# Patient Record
Sex: Male | Born: 1945 | Race: White | Hispanic: No
Health system: Midwestern US, Community
[De-identification: ages and names within clinical notes are randomized; demographics above are authoritative.]

## PROBLEM LIST (undated history)

## (undated) DIAGNOSIS — I10 Essential (primary) hypertension: Secondary | ICD-10-CM

## (undated) DIAGNOSIS — E039 Hypothyroidism, unspecified: Secondary | ICD-10-CM

## (undated) DIAGNOSIS — R52 Pain, unspecified: Secondary | ICD-10-CM

## (undated) DIAGNOSIS — E785 Hyperlipidemia, unspecified: Secondary | ICD-10-CM

---

## 2011-03-13 ENCOUNTER — Encounter

## 2011-05-01 LAB — ALT: ALT: 37 U/L (ref 4–40)

## 2011-05-01 LAB — LIPID PANEL
Chol/HDL Ratio: 3 (ref <5.0)
Cholesterol: 148 mg/dL (ref <200)
HDL: 49 mg/dL (ref >40)
LDL Cholesterol: 89 mg/dL (ref <100)
Triglycerides: 49 mg/dL (ref <150)

## 2011-05-15 NOTE — Patient Instructions (Signed)
Continue meds    Be active    Sunscreen and hats

## 2011-05-15 NOTE — Progress Notes (Signed)
Subjective:      Patient ID: Keith Pope is a 65 y.o. male.    Hand Pain   The incident occurred more than 1 week ago (increased pain left ring finger with swelling at PIP.  no trauma.  same side as duptyens.  not severe  enough to need any more medications or attention of hand specialist.). There was no injury mechanism. The pain is present in the left fingers. The quality of the pain is described as aching. The pain does not radiate. The pain is mild. The pain has been fluctuating since the incident. The symptoms are aggravated by nothing. He has tried NSAIDs for the symptoms. The treatment provided mild relief.       Review of Systems   Constitutional: Negative.    HENT: Negative.    Eyes: Negative.    Respiratory: Negative.    Cardiovascular: Negative.    Gastrointestinal: Negative.    Musculoskeletal: Positive for back pain.   Neurological: Negative for weakness and light-headedness.       Objective:   Physical Exam   Constitutional: He appears well-developed.   HENT:        ak and sk still. (did not use 5-fu).   Neck: Neck supple.        Posterior scar   Cardiovascular: Normal rate, regular rhythm and normal heart sounds.    Pulmonary/Chest: Breath sounds normal.   Abdominal: Soft. Bowel sounds are normal. He exhibits no mass.   Musculoskeletal: Normal range of motion.        Slightly swollen left PIP #4.  Full rom with little stiffness at joint.  dupytrens same.   Skin:        Chronic changes to face.  Rosacea look to cheeks.       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  allchronic problems stable.  Labs good.      Plan:      ctnu rx.  See 6 months.  Labs before.  Sunscreen and hats.

## 2011-09-11 ENCOUNTER — Telehealth

## 2011-09-11 NOTE — Telephone Encounter (Signed)
[facility] x2 to pick up order for Left Hand X-ray.

## 2011-09-11 NOTE — Telephone Encounter (Signed)
Patient states still  having a lot of pain in knuckle ring finger on left hand.  What is the next step?  Please advise.

## 2011-09-11 NOTE — Telephone Encounter (Signed)
Xray the left hand with attention to #4 PIP joint  See after xray

## 2011-09-12 NOTE — Progress Notes (Signed)
Subjective:      Patient ID: Keith Pope is a 65 y.o. male.    HPI    Review of Systems    Objective:   Physical Exam    Assessment:      Pt wants a flu shot      Plan:      Give pt flu shot

## 2011-09-14 NOTE — Telephone Encounter (Signed)
Report in my file.

## 2011-09-17 LAB — CBC WITH AUTO DIFFERENTIAL
Absolute Eos #: 0.1 10*3/uL (ref 0.0–0.4)
Absolute Lymph #: 1.6 10*3/uL (ref 1.0–4.8)
Absolute Mono #: 0.4 10*3/uL (ref 0.1–1.2)
Absolute Neut #: 4.1 10*3/uL (ref 1.8–7.7)
Basophils Absolute: 0 10*3/uL (ref 0.0–0.2)
Basophils: 0 % (ref 0–2)
Eosinophils %: 2 % (ref 1–4)
Hematocrit: 38.5 % — ABNORMAL LOW (ref 41–53)
Hemoglobin: 13.4 g/dL — ABNORMAL LOW (ref 13.5–17.5)
Lymphocytes: 26 % (ref 24–44)
MCH: 32.3 pg (ref 26–34)
MCHC: 34.9 g/dL (ref 31–37)
MCV: 92.6 fL (ref 80–100)
MPV: 8.1 fL (ref 6.0–12.0)
Monocytes: 7 % (ref 2–11)
Platelets: 227 10*3/uL (ref 140–450)
RBC: 4.15 m/uL — ABNORMAL LOW (ref 4.5–5.9)
RDW: 12.9 % (ref 12.5–15.4)
Seg Neutrophils: 65 % (ref 36–66)
WBC: 6.3 10*3/uL (ref 3.5–11.0)

## 2011-09-17 LAB — URIC ACID: Uric Acid: 5.5 mg/dL (ref 3.5–7.3)

## 2011-09-17 NOTE — Progress Notes (Signed)
Subjective:      Patient ID: Keith Pope is a 65 y.o. male.    HPI for recheck on finger.  Still painful with decrease in rom.  Some redness.  No other joint involved. No pain in palm with the dupytrens.  Had xray which showed arthritis.  Review of Systems   Constitutional: Negative for fever and chills.   Musculoskeletal: Positive for joint swelling. Negative for myalgias.   Skin: Negative for rash.       Objective:   Physical Exam   Musculoskeletal:        Red swollen left #4PIP with decrease in active and passive rom.  No warmth.  No other joint involved.  No rash.   Xray as above       Assessment:      ?gout; septic arthritis not likely      Plan:      Cbc, uric acid, esr  Stop celebrex and use duexis tid(samples)  i will call with results

## 2011-09-17 NOTE — Patient Instructions (Signed)
Stop your CELEBREX    Take the DUEXIS, one tablet with food, three times a day    i will call you with the blood results

## 2011-09-17 NOTE — Telephone Encounter (Signed)
Informed xray  Left  Hand,  arthritis in fingers. appointment  Today.

## 2011-09-18 LAB — SEDIMENTATION RATE: Sed Rate: 12 mm — ABNORMAL HIGH (ref 0–10)

## 2011-09-20 NOTE — Telephone Encounter (Signed)
Tell all were normal.   This info is on a result note from the day i saw them.

## 2011-09-21 NOTE — Telephone Encounter (Signed)
Rxs called to [facility],--spoke to Charise Killian-- #1- IIbuprofen 8oo mg, #270, sig 1 po tid with food, refill x3. #2--Pepcid 20 mg -#180,1 po bid , refill x3.

## 2011-09-21 NOTE — Telephone Encounter (Signed)
INFORMED LABS NORMAL. STATES since med duexis 800 mg , left ring finger not as sore, pain not as bad. Swelling is down a little and can bend a little bit. He has one more pill left for tonight.  What  Is next step? ( refill, go back on celebrex; or see specialist?)

## 2011-09-21 NOTE — Telephone Encounter (Signed)
Left message on recorder meds. Were called into his mail order pharmacy.

## 2011-09-21 NOTE — Telephone Encounter (Signed)
Results of lab work done 09/17/11 here at office?  Also, samples of Duexis 800mg ?  Please call patient back.

## 2011-09-21 NOTE — Telephone Encounter (Signed)
See result note and the note from yesterday.  Rx:  Ibuprofen 800 tid with food  #90  rfx12  pepcid 20  #60  Bid  rfx12

## 2011-09-27 NOTE — Telephone Encounter (Signed)
Is ok to take all three meds together

## 2011-09-27 NOTE — Telephone Encounter (Signed)
Patient informed of tests results.

## 2011-09-27 NOTE — Telephone Encounter (Signed)
Informed ok to take meds together.

## 2011-09-27 NOTE — Telephone Encounter (Signed)
Patient wants to know if he is to take Famotidine and the Ibuprofen together.  Also, Dr. Thermon Leyland  Wants him to take ASA 81mg  QD. And wants to know if its okay to take that with these meds. Please advise.

## 2011-10-05 MED ORDER — LEVOTHYROXINE SODIUM 75 MCG PO TABS
75 MCG | ORAL_TABLET | ORAL | Status: DC
Start: 2011-10-05 — End: 2012-06-07

## 2011-11-19 LAB — LIPID PANEL
Chol/HDL Ratio: 3.3 (ref <5.0)
Cholesterol: 152 mg/dL (ref <200)
HDL: 46 mg/dL (ref >40)
LDL Cholesterol: 87 mg/dL (ref <100)
Triglycerides: 95 mg/dL (ref <150)

## 2011-11-19 LAB — TSH: TSH: 2.19 mIU/L (ref 0.35–5.50)

## 2011-11-19 LAB — AST: AST: 26 U/L (ref 8–36)

## 2011-11-21 MED ORDER — HYDROCHLOROTHIAZIDE 25 MG PO TABS
25 MG | ORAL_TABLET | Freq: Every day | ORAL | Status: DC
Start: 2011-11-21 — End: 2012-01-29

## 2011-11-21 NOTE — Patient Instructions (Signed)
See in 6 months    Continue your medications    Be active physically and mentally    Do the blood tests one week before visit    Merry xmas

## 2011-11-21 NOTE — Progress Notes (Signed)
Subjective:      Patient ID: Keith Pope is a 65 y.o. male.    Hypertension  This is a chronic problem. The problem is unchanged. The problem is controlled. Pertinent negatives include no anxiety, blurred vision, chest pain, headaches, malaise/fatigue, neck pain, orthopnea, palpitations, peripheral edema, PND, shortness of breath or sweats. There is no history of chronic renal disease.   Hyperlipidemia  This is a chronic problem. Recent lipid tests were reviewed and are normal. Exacerbating diseases include hypothyroidism. He has no history of chronic renal disease, diabetes, liver disease or obesity. Pertinent negatives include no chest pain or shortness of breath.   not get full rom in finger after this summer.  No pain.  Functionally ok    Review of Systems   Constitutional: Negative.  Negative for malaise/fatigue.   HENT: Negative for neck pain.    Eyes: Negative.  Negative for blurred vision.   Respiratory: Negative.  Negative for shortness of breath.    Cardiovascular: Negative for chest pain, palpitations, orthopnea and PND.   Gastrointestinal: Negative.    Genitourinary: Negative.    Musculoskeletal: Negative.    Skin: Negative.    Neurological: Negative.  Negative for headaches.       Objective:   Physical Exam   Vitals reviewed.  Constitutional: He appears well-developed and well-nourished. No distress.   Neck: Normal range of motion. Neck supple.   Cardiovascular: Normal rate, regular rhythm and normal heart sounds.    Pulmonary/Chest: Effort normal and breath sounds normal.   Abdominal: Soft. Bowel sounds are normal. He exhibits no distension and no mass.   Genitourinary: Rectum normal and penis normal.        Decrease in full flexion at PIP #4 left   Musculoskeletal: He exhibits no edema and no tenderness.   Lymphadenopathy:     He has no cervical adenopathy.   Neurological: He is alert.   Skin: Skin is warm and dry.   labs good    Assessment:      Chronic problems  Doing well      Plan:      Stay  active  ctnu present

## 2011-11-28 NOTE — Telephone Encounter (Signed)
I think this should wait for Dr. Nolene Ebbs to address.  Ok xanax rx .25 mg #20 i po qid prn.

## 2011-11-28 NOTE — Telephone Encounter (Addendum)
Patient seen on 11/21/11, prescribed RX Hydrochlorothiazide 25mg .  He states BP is actually getting higher.  It is running around 170/86.  He said his Mother is terminal and didn't know if this could be making his BP go higher?  He wanted to know if you could prescribe something for his anxiety and change BP medication?    Patient called back.  He wanted me to make sure to mention that for the last couple of days he has been experiencing some chest pains-feels like he needs to belch.  He has been taking Tums and that seems to help somewhat.

## 2011-11-28 NOTE — Telephone Encounter (Signed)
Rx called into pharmacy and left message on recorder  Pt advised to check pharm after 5.

## 2011-11-29 NOTE — Telephone Encounter (Signed)
Pt advised- continue HCTZ, we're adding Norvasc 5mg  qd, see Dr Nolene Ebbs in 2-3 weeks. Appt set for 1-7- 13.

## 2011-11-29 NOTE — Telephone Encounter (Signed)
Prescription has been called in, and patient has been informed.

## 2011-11-29 NOTE — Telephone Encounter (Signed)
ctnu HCTZ  Rx:  norvasc 5          #30          One daily    See in 2-3 weeks

## 2011-12-19 NOTE — Patient Instructions (Signed)
i will call as soon as i have a report on the stress test    Continue present medications

## 2011-12-19 NOTE — Progress Notes (Signed)
Subjective:      Patient ID: Keith Pope is a 66 y.o. male.    HPI  For recheck on bp.  Gets low bp readings at home after he uses the exercise bike.  No pain during.  Get sob and some ache in anterior chest when walking in the cold.  No other cardiac or respiratory issue.  Review of Systems   Respiratory: Negative.    Cardiovascular: Negative for palpitations and leg swelling.   Neurological: Negative.        Objective:   Physical Exam   Vitals reviewed.  Constitutional: He appears well-developed and well-nourished.   Neck: Normal range of motion. No JVD present.   Cardiovascular: Normal rate, regular rhythm and normal heart sounds.    Pulmonary/Chest: Effort normal.   Musculoskeletal: He exhibits no edema.   ekg with non specific changes    Assessment:      Hypertension with fall with exercise and cold exposure pain; worrisome for cad with ascvd hx      Plan:      Stress  ctnu present meds

## 2011-12-26 MED ORDER — AMLODIPINE BESYLATE 5 MG PO TABS
5 MG | ORAL_TABLET | Freq: Every day | ORAL | Status: DC
Start: 2011-12-26 — End: 2012-03-06

## 2011-12-26 NOTE — Telephone Encounter (Signed)
[facility] [phone]

## 2012-01-01 NOTE — Telephone Encounter (Signed)
Informed drink plenty of fluids, and f/u prn.  Noted stress test was normal.

## 2012-01-01 NOTE — Telephone Encounter (Signed)
If only drop in bp without any other symptoms just needs to stay well hydrated.  If symptomatic with blood pressure drop, needs stress test

## 2012-01-01 NOTE — Telephone Encounter (Signed)
Informed stress test ok. Patient states bp drops after exercise, and wants to know what to do?

## 2012-01-29 NOTE — Telephone Encounter (Signed)
Call in 30 days for HCTZ to [facility] patient leaving out of town 02/02/12

## 2012-01-30 MED ORDER — HYDROCHLOROTHIAZIDE 25 MG PO TABS
25 MG | ORAL_TABLET | Freq: Every day | ORAL | Status: AC
Start: 2012-01-30 — End: ?

## 2012-03-07 MED ORDER — AMLODIPINE BESYLATE 5 MG PO TABS
5 MG | ORAL_TABLET | Freq: Every day | ORAL | Status: DC
Start: 2012-03-07 — End: 2013-02-27

## 2012-03-10 MED ORDER — SIMVASTATIN 20 MG PO TABS
20 MG | ORAL_TABLET | ORAL | Status: DC
Start: 2012-03-10 — End: 2012-07-02

## 2012-05-14 MED ORDER — TRAMADOL HCL 50 MG PO TABS
50 MG | ORAL_TABLET | ORAL | Status: DC
Start: 2012-05-14 — End: 2012-12-24

## 2012-06-09 MED ORDER — LEVOTHYROXINE SODIUM 75 MCG PO TABS
75 MCG | ORAL_TABLET | ORAL | Status: DC
Start: 2012-06-09 — End: 2013-02-11

## 2012-06-26 LAB — LIPID PANEL
Chol/HDL Ratio: 4.3
Cholesterol, Total: 233
HDL: 54 mg/dL (ref 35–70)
LDL Calculated: 155 mg/dL (ref 0–160)
Triglycerides: 120 mg/dL
VLDL: 24 mg/dL

## 2012-07-02 MED ORDER — ATORVASTATIN CALCIUM 10 MG PO TABS
10 MG | ORAL_TABLET | Freq: Every day | ORAL | Status: DC
Start: 2012-07-02 — End: 2013-05-12

## 2012-07-02 NOTE — Patient Instructions (Signed)
Be active physically and mentally    Eat well balanced diet    Use the ATORVASTATIN for the cholesterol    See in 6 months    Fasting labs one week before visit

## 2012-07-02 NOTE — Progress Notes (Signed)
Diabetic visit information    BP Readings from Last 2 Encounters:   07/02/12 138/70   12/19/11 160/86       LDL Cholesterol (mg/dL)   Date Value   09/81/1914 87         LDL Calculated (mg/dL)   Date Value   7/82/9562 155               Tobacco use:  Patient  reports that he quit smoking about 9 years ago. He does not have any smokeless tobacco history on file.  If Smoker - Cessation materials given?- NA    Is Daily aspirin on medication list?: - No  If not and if the patient also has a history of vascular disease or is at increased risk of cardiovascular disease, the patient may be a candidate for daily aspirin. Discuss with physician.     Diabetic retinal exam done? - No   If yes, document date performed in Health Maintenance.     Is patient taking any medications for diabetes? -   Yes   If yes, see medication list  Are you having any side effects from diabetes medications? -  No    Are you taking cholesterol medication?  -   Yes   If yes, see medication list  Are you having any side effects from cholesterol medications? - No    Have you seen any other physician or provider since your last visit yes - Cardiologist    Have you had any other diagnostic tests since your last visit? no    Have you changed or stopped any medications since your last visit including any over-the-counter medicines, vitamins, or herbal medicines? no     Are you taking all your prescribed medications? Yes  If NO, why? -  N/A    Is patient taking any over the counter medications    No   If yes, see medication list      Diabetic Patient Self-Management Goal for this visit.    What is your goal for your visit today?   [x]  Increase activity-exercise for 30 min daily - 3 to 4 days per week   []  Improved diet - adhere to recommended diabetic diet/low cholesterol    []  Take medications as prescribed and call the office if any troubles with medications develop   []  will check blood sugars at least one time daily   []  schedule an appointment with a  diabetes educator   []  schedule yearly diabetic eye exam   []  will work on weight reduction program   []       Barriers to success: none  Plan for overcoming my barriers: N/A     Confidence: 10/10  Date goal set: 07/02/2012  Date expected to reach goal: 6months                     Microalbumin performed if applicable? - No   (due every 6 to 12 months)    BP taken with correct size cuff? - Yes   Repeated if > 130/80 No     Monofilament placed on counter? - No   (Diabetic foot exam - yearly)  Shoes and socks removed? - No    Reminders - reviewed with patient today - Yes  ?? Tobacco -  If patient uses tobacco and intervention is done today - remember to mark counseling in the vital signs section and to give educational materials.   ?? Foot Exam - If  monofilament exam has not bee done within the past year, prepare the patient and have the testing equipment ready.   ?? Eye Exam - Ask about diabetic retinal (eye) exam and record in health maintenance.  ?? BP - If BP >140/90, repeat reading. Check cuff size.    ?? Microalbumin urine test - If not performed in the last year, notify physician. Enter order in pending status if your physician desires.     Diabetic Health Maintenance  No health maintenance topics applied.      HPI Notes

## 2012-07-02 NOTE — Progress Notes (Signed)
Subjective:      Patient ID: Keith Pope is a 66 y.o. male.    Hyperlipidemia  This is a chronic problem. The current episode started more than 1 year ago. The problem is uncontrolled. Recent lipid tests were reviewed and are high. Exacerbating diseases include hypothyroidism. He has no history of chronic renal disease, diabetes, liver disease, obesity or nephrotic syndrome. Pertinent negatives include no chest pain, focal sensory loss, focal weakness, leg pain, myalgias or shortness of breath. Treatments tried: felt lousy when taking zocor. stopped taking it.    Hypertension  This is a chronic problem. The current episode started more than 1 year ago. The problem is unchanged. The problem is controlled. Pertinent negatives include no anxiety, blurred vision, chest pain, headaches, malaise/fatigue, neck pain, orthopnea, palpitations, peripheral edema, PND, shortness of breath or sweats. There is no history of chronic renal disease.   otherwise feeling fine.    Review of Systems   Constitutional: Negative.  Negative for malaise/fatigue.   HENT: Negative for neck pain.    Eyes: Negative.  Negative for blurred vision.   Respiratory: Negative for shortness of breath.    Cardiovascular: Negative for chest pain, palpitations, orthopnea and PND.   Genitourinary: Negative.    Musculoskeletal: Negative for myalgias.   Neurological: Negative for focal weakness and headaches.       Objective:   Physical Exam   Vitals reviewed.  Constitutional: He is oriented to person, place, and time. He appears well-developed and well-nourished. No distress.   Neck: Neck supple. No JVD present. No thyromegaly present.   Cardiovascular: Normal rate, regular rhythm, normal heart sounds and intact distal pulses.    Pulmonary/Chest: Effort normal and breath sounds normal.   Abdominal: Soft. Bowel sounds are normal.   Musculoskeletal: Normal range of motion. He exhibits no edema and no tenderness.   Lymphadenopathy:     He has no cervical  adenopathy.   Neurological: He is alert and oriented to person, place, and time.   Skin: Skin is warm and dry.   ldl 157    Assessment:      Chronic problems  Adverse reaction to zocor       Plan:      Trial lipitor 10  Check 6 mo

## 2012-08-20 MED ORDER — IBUPROFEN 800 MG PO TABS
800 MG | ORAL_TABLET | ORAL | Status: DC
Start: 2012-08-20 — End: 2013-09-22

## 2012-08-20 MED ORDER — FAMOTIDINE 20 MG PO TABS
20 MG | ORAL_TABLET | ORAL | Status: DC
Start: 2012-08-20 — End: 2013-07-03

## 2012-10-03 MED ORDER — HYDROCHLOROTHIAZIDE 25 MG PO TABS
25 MG | ORAL_TABLET | ORAL | Status: DC
Start: 2012-10-03 — End: 2013-06-24

## 2012-12-24 LAB — LIPID PANEL
Chol/HDL Ratio: 3.2 (ref <5.0)
Cholesterol: 149 mg/dL (ref <200)
HDL: 46 mg/dL (ref >40)
LDL Cholesterol: 84 mg/dL (ref <100)
Triglycerides: 94 mg/dL (ref <150)

## 2012-12-24 LAB — ALT: ALT: 34 U/L (ref 4–40)

## 2012-12-24 LAB — TSH WITH REFLEX: TSH: 3.26 mIU/L (ref 0.35–5.50)

## 2012-12-24 MED ORDER — TRAMADOL HCL 50 MG PO TABS
50 MG | ORAL_TABLET | Freq: Four times a day (QID) | ORAL | Status: DC | PRN
Start: 2012-12-24 — End: 2013-01-02

## 2012-12-24 NOTE — Telephone Encounter (Signed)
Rx called to pharmacy Patient has been notified.

## 2012-12-24 NOTE — Telephone Encounter (Signed)
error 

## 2013-01-02 MED ORDER — TRAMADOL HCL 50 MG PO TABS
50 MG | ORAL_TABLET | Freq: Four times a day (QID) | ORAL | Status: DC | PRN
Start: 2013-01-02 — End: 2013-09-01

## 2013-01-02 NOTE — Progress Notes (Signed)
Subjective:      Patient ID: Keith Pope is a 67 y.o. male.    Hypertension  This is a chronic problem. The current episode started more than 1 year ago. The problem is unchanged. The problem is controlled. Pertinent negatives include no anxiety, blurred vision, chest pain, headaches, malaise/fatigue, neck pain, orthopnea, palpitations, peripheral edema, PND, shortness of breath or sweats. There is no history of chronic renal disease.   Hyperlipidemia  This is a chronic problem. The current episode started more than 1 year ago. The problem is controlled. Recent lipid tests were reviewed and are normal. Exacerbating diseases include hypothyroidism. He has no history of chronic renal disease, diabetes, liver disease, obesity or nephrotic syndrome. Pertinent negatives include no chest pain, focal sensory loss, focal weakness, leg pain, myalgias or shortness of breath.   joints are ok  No hat or cold intollerance. No weight change.  No kidney stone problems.    Review of Systems   Constitutional: Negative for malaise/fatigue.   HENT: Negative for neck pain.    Eyes: Negative for blurred vision.   Respiratory: Negative for shortness of breath.    Cardiovascular: Negative for chest pain, palpitations, orthopnea and PND.   Musculoskeletal: Negative for myalgias.   Neurological: Negative for focal weakness and headaches.       Objective:   Physical Exam   Vitals reviewed.  Constitutional: He is oriented to person, place, and time. He appears well-developed and well-nourished. No distress.   Neck: Normal range of motion. Neck supple. No JVD present. No thyromegaly present.   Cardiovascular: Normal rate, regular rhythm, normal heart sounds and intact distal pulses.  Exam reveals no gallop and no friction rub.    No murmur heard.  Pulmonary/Chest: Effort normal and breath sounds normal. No respiratory distress.   Abdominal: Soft. Bowel sounds are normal. He exhibits no mass. There is no tenderness.   Genitourinary: Rectum  normal and prostate normal.   Musculoskeletal: He exhibits no edema and no tenderness.   Lymphadenopathy:     He has no cervical adenopathy.   Neurological: He is alert and oriented to person, place, and time.   Skin: Skin is warm and dry.   labs ok    Assessment:      Doing well       Plan:      ctnu present  See 6 mo

## 2013-01-02 NOTE — Patient Instructions (Signed)
Be active physically and mentally    Eat well balanced diet    contnue your medications    See in 6 months    Fasting blood test one week before visit

## 2013-01-02 NOTE — Progress Notes (Signed)
Health Maintenance   Topic Date Due   ??? Colon Cancer Screening Colonoscopy  11/17/1995   ??? Psa Counseling  11/17/1995   ??? Zostavax Vaccine  11/16/2005   ??? Pneumovax Vaccine Adult (#1) 11/16/2010   ??? Depression Screening  11/16/2010   ??? Flu Vaccine Yearly (Adult)  08/10/2013   ??? Tetanus Vaccine Adult (11 Years And Up)  02/04/2020       No results found for this basename: laba1c             ( goal A1C is < 7)   No results found for this basename: labmicr     LDL Cholesterol (mg/dL)   Date Value   12/28/1476 84         LDL Calculated (mg/dL)   Date Value   2/95/6213 155        (goal LDL is <100)   AST (U/L)   Date Value   11/19/2011 26         ALT (U/L)   Date Value   12/24/2012 34      BP Readings from Last 3 Encounters:   07/02/12 138/70   12/19/11 160/86   11/21/11 152/80          (goal 120/80)      Next Visit Date:  01/02/2013    Patient Active Problem List:     Hypothyroid     Kidney stones     OA (osteoarthritis)     DDD (degenerative disc disease), cervical     Colon polyps     Increased serum lipids     Hypertension          Hypertension visit information    BP Readings from Last 2 Encounters:   07/02/12 138/70   12/19/11 160/86       LDL Calculated (mg/dL)   Date Value   0/86/5784 155         LDL Cholesterol (mg/dL)   Date Value   6/96/2952 84         HDL (mg/dL)   Date Value   8/41/3244 46             Are you taking any over-the-counter medicines, vitamins, or herbal medicines? - no     Are you taking any medications for hypertension?  -  Yes   If yes, see medication list  Are you having any side effects from hypertension medications? -  No     Are you taking cholesterol medication?  -   No   If yes, see medication list  Are you having any side effects from cholesterol medications? - No    Tobacco use:  Patient  reports that he quit smoking about 9 years ago. He does not have any smokeless tobacco history on file.  If Smoker - Cessation materials given? - NA    Have you seen any other physician or provider since your  last visit yes -Ortho     Have you had any other diagnostic tests since your last visit? no    Have you changed or stopped any medications since your last visit including any over-the-counter medicines, vitamins, or herbal medicines? no     Are you taking all your prescribed medications? Yes  If NO, why? -  N/A    Patient Self-Management Goal for this visit.     What is your goal for your visit today?    []  I will increase my physical activity level  []  I will exercise  for 30 minutes, 3 to 5 days a week  []  I will take all medications as prescribed  []  I will follow low cholesterol/low sodium diet recommendation  []  I will work on weight reduction  []  I will follow up with specialists as recommend  [x]  I will check my blood pressure weekly   []      Barriers to success: none  Plan for overcoming my barriers: N/A     Confidence: 8/10  Date goal set: 01/02/2013  Date expected to reach goal: 6months       Reminders - reviewed with patient today - Yes  ?? Tobacco -  If patient uses tobacco and intervention is done today - remember to mark counseling in the vital signs section and to give educational materials.   ?? BP - If BP >140/90, repeat reading. Check cuff size.        HPI Notes

## 2013-01-21 NOTE — Telephone Encounter (Signed)
Keith Pope, from [facility] home health called wanted [place] to know she discharge physical therapy.

## 2013-02-11 MED ORDER — LEVOTHYROXINE SODIUM 75 MCG PO TABS
75 MCG | ORAL_TABLET | ORAL | Status: DC
Start: 2013-02-11 — End: 2013-05-31

## 2013-02-27 MED ORDER — AMLODIPINE BESYLATE 5 MG PO TABS
5 MG | ORAL_TABLET | ORAL | Status: DC
Start: 2013-02-27 — End: 2013-11-19

## 2013-05-12 MED ORDER — ATORVASTATIN CALCIUM 10 MG PO TABS
10 MG | ORAL_TABLET | ORAL | Status: DC
Start: 2013-05-12 — End: 2014-05-07

## 2013-06-01 MED ORDER — LEVOTHYROXINE SODIUM 75 MCG PO TABS
75 MCG | ORAL_TABLET | ORAL | Status: DC
Start: 2013-06-01 — End: 2014-05-04

## 2013-06-17 LAB — BASIC METABOLIC PANEL
Anion Gap: 17 mmol/L — ABNORMAL HIGH (ref 8–16)
BUN: 15 mg/dL (ref 8–23)
CO2: 26 mmol/L (ref 20–31)
Calcium: 9.7 mg/dL (ref 8.6–10.0)
Chloride: 102 mmol/L (ref 98–107)
Creatinine: 0.87 mg/dL (ref 0.70–1.20)
GFR African American: >60 mL/min (ref >60)
GFR Non-African American: >60 mL/min (ref >60)
Glucose: 91 mg/dL (ref 70–99)
Potassium: 4.1 mmol/L (ref 3.5–5.1)
Sodium: 141 mmol/L (ref 133–142)

## 2013-06-17 LAB — LIPID PANEL
Chol/HDL Ratio: 2.8 (ref <5)
Cholesterol: 161 mg/dL (ref <200)
HDL: 57 mg/dL (ref >40)
LDL Cholesterol: 87 mg/dL (ref 0–130)
Triglycerides: 86 mg/dL (ref <150)

## 2013-06-17 LAB — ALT: ALT: 22 U/L (ref 5–41)

## 2013-06-17 LAB — TSH: TSH: 1.76 mIU/L (ref 0.27–4.20)

## 2013-06-24 NOTE — Patient Instructions (Signed)
Be active physically and mentally    Eat well balanced diet    Continue your medications    See in 6 months

## 2013-06-24 NOTE — Progress Notes (Signed)
Subjective:      Patient ID: Keith Pope is a 67 y.o. male.    Hypertension  This is a chronic problem. The current episode started more than 1 year ago. The problem is unchanged. The problem is controlled. Pertinent negatives include no anxiety, blurred vision, chest pain, headaches, malaise/fatigue, neck pain, orthopnea, palpitations, peripheral edema, PND, shortness of breath or sweats. There is no history of chronic renal disease.   Toe Pain   Incident onset: no injury. just onset of pain at MTP #1 left fot. worse with walking up grades. no redness or swelling. The quality of the pain is described as aching. The pain is mild. The pain has been fluctuating since onset. Pertinent negatives include no inability to bear weight, loss of motion, loss of sensation, muscle weakness, numbness or tingling. The symptoms are aggravated by movement. He has tried acetaminophen, NSAIDs and rest for the symptoms. The treatment provided moderate relief.   Hyperlipidemia  This is a chronic problem. The current episode started more than 1 year ago. The problem is controlled. Recent lipid tests were reviewed and are normal. Exacerbating diseases include hypothyroidism. He has no history of chronic renal disease, diabetes, liver disease, obesity or nephrotic syndrome. Pertinent negatives include no chest pain, focal sensory loss, focal weakness, leg pain, myalgias or shortness of breath.       Review of Systems   Constitutional: Positive for activity change. Negative for malaise/fatigue, appetite change and unexpected weight change.   HENT: Negative for neck pain.    Eyes: Negative for blurred vision.   Respiratory: Negative for shortness of breath.    Cardiovascular: Negative for chest pain, palpitations, orthopnea and PND.   Gastrointestinal: Negative.    Genitourinary: Negative.    Musculoskeletal: Positive for arthralgias and gait problem. Negative for myalgias.   Skin: Negative.    Neurological: Negative for tingling, focal  weakness, numbness and headaches.   Hematological: Negative.        Objective:   Physical Exam   Constitutional: He is oriented to person, place, and time. He appears well-developed and well-nourished. No distress.   Neck: Neck supple.   Cardiovascular: Normal rate, regular rhythm, normal heart sounds and intact distal pulses.    Pulmonary/Chest: Effort normal and breath sounds normal.   Abdominal: Soft. Bowel sounds are normal. There is no tenderness.   Musculoskeletal: Normal range of motion. He exhibits no edema or tenderness.   MTP with no tenderness, redness, swelling.   Lymphadenopathy:     He has no cervical adenopathy.   Neurological: He is alert and oriented to person, place, and time.   Skin: Skin is warm and dry.   Vitals reviewed.  labs all good    Assessment:      Chronic problems stable  Arthritis toe joint       Plan:      ctnu presetn  See instructions

## 2013-06-24 NOTE — Progress Notes (Signed)
Hypertension visit information    BP Readings from Last 2 Encounters:   01/02/13 150/64   07/02/12 138/70       LDL Calculated (mg/dL)   Date Value   1/61/0960 155         LDL Cholesterol (mg/dL)   Date Value   03/14/4097 87         HDL (mg/dL)   Date Value   12/10/9145 57         BUN (mg/dL)   Date Value   07/11/9561 15         CREATININE (mg/dL)   Date Value   12/13/863 0.87         Glucose (mg/dL)   Date Value   06/17/4695 91             Are you taking any over-the-counter medicines, vitamins, or herbal medicines? - no     Are you taking any medications for hypertension?  -  Yes   If yes, see medication list  Are you having any side effects from hypertension medications? -  No     Are you taking cholesterol medication?  -   Yes   If yes, see medication list  Are you having any side effects from cholesterol medications? - No    Tobacco use:  Patient  reports that he quit smoking about 10 years ago. He does not have any smokeless tobacco history on file.  If Smoker - Cessation materials given? - No    Have you seen any other physician or provider since your last visit no    Have you had any other diagnostic tests since your last visit? no    Have you changed or stopped any medications since your last visit including any over-the-counter medicines, vitamins, or herbal medicines? no     Are you taking all your prescribed medications? Yes  If NO, why? -  N/A    Patient Self-Management Goal for this visit.     What is your goal for your visit today?    []  I will increase my physical activity level  []  I will exercise for 30 minutes, 3 to 5 days a week  []  I will take all medications as prescribed  []  I will follow low cholesterol/low sodium diet recommendation  []  I will work on weight reduction  []  I will follow up with specialists as recommend  []  I will check my blood pressure weekly   []  6 month check    Barriers to success: none  Plan for overcoming my barriers: N/A     Confidence: 10/10  Date goal set: 06/24/2013  Date  expected to reach goal: 1day       Reminders - reviewed with patient today - No  ?? Tobacco -  If patient uses tobacco and intervention is done today - remember to mark counseling in the vital signs section and to give educational materials.   ?? BP - If BP >140/90, repeat reading. Check cuff size.      *Ensure Health Maintenance Modifier for Lipid screening Annual is added to Health Maintenance*      Health Maintenance Due   Topic Date Due   ??? COLON CANCER SCREENING COLONOSCOPY  11/17/1995   ??? ZOSTAVAX VACCINE  11/16/2005   ??? PNEUMOVAX VACCINE ADULT (#1) 11/16/2010   ??? DEPRESSION SCREENING  11/16/2010   ??? FALLS RISK  11/16/2010       Medical history Review  Past Medical, Family, and Social  History reviewed and  contribute to the patient presenting condition    HPI Notes

## 2013-07-03 MED ORDER — FAMOTIDINE 20 MG PO TABS
20 MG | ORAL_TABLET | ORAL | Status: DC
Start: 2013-07-03 — End: 2014-06-28

## 2013-07-31 MED ORDER — HYDROCHLOROTHIAZIDE 25 MG PO TABS
25 MG | ORAL_TABLET | ORAL | Status: DC
Start: 2013-07-31 — End: 2013-12-30

## 2013-09-01 MED ORDER — TRAMADOL HCL 50 MG PO TABS
50 MG | ORAL_TABLET | ORAL | Status: DC
Start: 2013-09-01 — End: 2013-12-30

## 2013-09-01 NOTE — Telephone Encounter (Signed)
Rx called to pharmacy

## 2013-09-22 MED ORDER — IBUPROFEN 800 MG PO TABS
800 MG | ORAL_TABLET | ORAL | Status: AC
Start: 2013-09-22 — End: ?

## 2013-09-23 NOTE — Progress Notes (Deleted)
Subjective:      Patient ID: Keith Pope is a 67 y.o. male.    HPI    Review of Systems    Objective:   Physical Exam    Assessment:      ***      Plan:      ***

## 2013-09-23 NOTE — Progress Notes (Signed)
Patient here for flu shot.  It was given and was tolerated well.

## 2013-11-19 MED ORDER — AMLODIPINE BESYLATE 5 MG PO TABS
5 MG | ORAL_TABLET | ORAL | Status: DC
Start: 2013-11-19 — End: 2014-11-14

## 2013-12-30 MED ORDER — TRAMADOL HCL 50 MG PO TABS
50 MG | ORAL_TABLET | Freq: Four times a day (QID) | ORAL | Status: AC | PRN
Start: 2013-12-30 — End: ?

## 2013-12-30 NOTE — Progress Notes (Signed)
Hypertension visit information    BP Readings from Last 2 Encounters:   06/24/13 134/74   01/02/13 150/64       LDL Calculated (mg/dL)   Date Value   8/11/91477/17/2013 155         LDL Cholesterol (mg/dL)   Date Value   8/2/95627/08/2013 87         HDL (mg/dL)   Date Value   1/3/08657/08/2013 57         BUN (mg/dL)   Date Value   7/8/46967/08/2013 15         CREATININE (mg/dL)   Date Value   2/9/52847/08/2013 0.87         Glucose (mg/dL)   Date Value   1/3/24407/08/2013 91             Are you taking any over-the-counter medicines, vitamins, or herbal medicines? - no     Are you taking any medications for hypertension?  -  Yes   If yes, see medication list  Are you having any side effects from hypertension medications? -  No     Are you taking cholesterol medication?  -   No   If yes, see medication list  Are you having any side effects from cholesterol medications? - No    Tobacco use:  Patient  reports that he quit smoking about 10 years ago. He does not have any smokeless tobacco history on file.  If Smoker - Cessation materials given? - NA    Have you seen any other physician or provider since your last visit no    Have you had any other diagnostic tests since your last visit? no    Have you changed or stopped any medications since your last visit including any over-the-counter medicines, vitamins, or herbal medicines? no     Are you taking all your prescribed medications? Yes  If NO, why? -  N/A           Patient Self-Management Goal for this visit.   What is your goal for your visit today? - check up   Barriers to success: none   Plan for overcoming my barriers: N/A      Confidence: 10/10   Date goal set: 12/30/2013   Date expected to reach goal: today      Medical history Review  Past Medical, Family, and Social History reviewed and does contribute to the patient presenting condition    Health Maintenance Due   Topic Date Due   ??? PNEUMOVAX VACCINE ADULT (##1) 11/16/2010       Reminders - reviewed with patient today - Yes  ?? Tobacco -  If patient uses tobacco and  intervention is done today - remember to mark counseling in the vital signs section and to give educational materials.   ?? BP - If BP >140/90, repeat reading. Check cuff size.        HPI Notes

## 2013-12-30 NOTE — Patient Instructions (Addendum)
Be active physically and mentally    Eat well balanced diet    Continue your medications    See in 6 months    Fasting blood test one week before visit

## 2013-12-30 NOTE — Progress Notes (Signed)
Subjective:      Patient ID: Keith Pope is a 68 y.o. male.    Hypertension  This is a chronic problem. The current episode started more than 1 year ago. The problem is unchanged. The problem is controlled. Pertinent negatives include no anxiety, blurred vision, chest pain, headaches, malaise/fatigue, neck pain, orthopnea, palpitations, peripheral edema, PND, shortness of breath or sweats. There is no history of chronic renal disease.   Hyperlipidemia  This is a chronic problem. The current episode started more than 1 year ago. The problem is controlled. Recent lipid tests were reviewed and are normal. Exacerbating diseases include hypothyroidism. He has no history of chronic renal disease, diabetes, liver disease, obesity or nephrotic syndrome. Pertinent negatives include no chest pain, focal sensory loss, focal weakness, leg pain, myalgias or shortness of breath.     No kidney stone issues.  Colonoscopy UTD and will be getting this year; got letter from [facility].  No endocrine problems.  Some spasm and cramp in left foot and toes and ankle. Seems worse at night. Gets plantar flexion of great toe and ankle.    Review of Systems   Constitutional: Negative for malaise/fatigue, activity change, appetite change and unexpected weight change.   HENT: Negative.    Eyes: Negative for blurred vision.   Respiratory: Negative for cough, shortness of breath and stridor.    Cardiovascular: Negative for chest pain, palpitations, orthopnea, leg swelling and PND.   Gastrointestinal: Negative.    Genitourinary: Negative.    Musculoskeletal: Negative for myalgias and neck pain.   Skin: Negative.    Neurological: Negative for focal weakness and headaches.       Objective:   Physical Exam   Constitutional: He is oriented to person, place, and time. He appears well-developed and well-nourished. No distress.   Neck: Normal range of motion. Neck supple. No thyromegaly present.   Cardiovascular: Normal rate, regular rhythm and normal heart  sounds.  Exam reveals no gallop.    No murmur heard.  Pulmonary/Chest: Effort normal and breath sounds normal.   Abdominal: Soft. Bowel sounds are normal. He exhibits no mass. There is no tenderness.   Genitourinary: Rectum normal and prostate normal.   Musculoskeletal: Normal range of motion. He exhibits no edema or tenderness.   Lymphadenopathy:     He has no cervical adenopathy.   Neurological: He is alert and oriented to person, place, and time. He exhibits normal muscle tone.   Skin: Skin is warm and dry.   Vitals reviewed.      Assessment:      Lots chronic problems       Plan:      See orders and instructions

## 2014-05-04 MED ORDER — LEVOTHYROXINE SODIUM 75 MCG PO TABS
75 MCG | ORAL_TABLET | ORAL | Status: DC
Start: 2014-05-04 — End: 2015-04-09

## 2014-05-07 MED ORDER — ATORVASTATIN CALCIUM 10 MG PO TABS
10 MG | ORAL_TABLET | ORAL | Status: AC
Start: 2014-05-07 — End: ?

## 2014-06-28 MED ORDER — FAMOTIDINE 20 MG PO TABS
20 MG | ORAL_TABLET | ORAL | Status: DC
Start: 2014-06-28 — End: 2015-05-31

## 2014-07-26 MED ORDER — HYDROCHLOROTHIAZIDE 25 MG PO TABS
25 MG | ORAL_TABLET | ORAL | Status: AC
Start: 2014-07-26 — End: ?

## 2014-11-15 MED ORDER — AMLODIPINE BESYLATE 5 MG PO TABS
5 MG | ORAL_TABLET | ORAL | Status: AC
Start: 2014-11-15 — End: ?

## 2015-01-10 ENCOUNTER — Encounter

## 2015-04-11 MED ORDER — LEVOTHYROXINE SODIUM 75 MCG PO TABS
75 MCG | ORAL_TABLET | ORAL | Status: DC
Start: 2015-04-11 — End: 2015-07-10

## 2015-04-11 NOTE — Telephone Encounter (Signed)
Health Maintenance   Topic Date Due   ??? HEPATITIS  C SCREENING  01-20-1946   ??? PNEUMO VAC >= 65 (1 of 2 - PCV13) 11/16/2010   ??? FLU VACCINE YEARLY (ADULT)  07/11/2015   ??? LIPIDS  06/17/2018   ??? TETANUS VACCINE ADULT (11 YEARS AND UP)  02/04/2020   ??? COLON CANCER SCREENING COLONOSCOPY  11/21/2020   ??? ZOSTAVAX VACCINE  Completed       No results found for: LABA1C          ( goal A1C is < 7)   No results found for: LABMICR  LDL CHOLESTEROL (mg/dL)   Date Value   29/52/841307/08/2013 87     LDL CALCULATED (mg/dL)   Date Value   24/40/102707/17/2013 155       (goal LDL is <100)   AST (U/L)   Date Value   11/19/2011 26     ALT (U/L)   Date Value   06/17/2013 22     BUN (mg/dL)   Date Value   25/36/644007/08/2013 15     BP Readings from Last 3 Encounters:   12/30/13 130/60   06/24/13 134/74   01/02/13 150/64          (goal 120/80)    All Future Testing planned in [facility]  Lab Frequency Next Occurrence   Lipid Panel Once 01/11/2016   ALT Once 01/11/2016   Basic Metabolic Panel Once 01/11/2016   TSH with Reflex Once 01/11/2016       Next Visit Date:  No future appointments.         Patient Active Problem List:     Hypothyroid     Kidney stones     OA (osteoarthritis)     DDD (degenerative disc disease), cervical     Colon polyps     Increased serum lipids     Hypertension

## 2015-05-31 MED ORDER — FAMOTIDINE 20 MG PO TABS
20 MG | ORAL_TABLET | ORAL | 0 refills | Status: DC
Start: 2015-05-31 — End: 2015-08-27

## 2015-05-31 NOTE — Telephone Encounter (Signed)
Our records indicate that it has been longer than 12 months since you've seen your primary care physician. Please contact our office to schedule an appointment.      Health Maintenance   Topic Date Due   ??? HEPATITIS  C SCREENING  [birth year]   ??? PNEUMO VAC >= 65 (1 of 2 - PCV13) 11/16/2010   ??? FLU VACCINE YEARLY (ADULT)  07/11/2015   ??? LIPIDS  06/17/2018   ??? TETANUS VACCINE ADULT (11 YEARS AND UP)  02/04/2020   ??? COLON CANCER SCREENING COLONOSCOPY  11/21/2020   ??? ZOSTAVAX VACCINE  Completed       No results found for: LABA1C          ( goal A1C is < 7)   No results found for: LABMICR  LDL CHOLESTEROL (mg/dL)   Date Value   60/03/5996 87     LDL CALCULATED (mg/dL)   Date Value   74/14/2395 155       (goal LDL is <100)   AST (U/L)   Date Value   11/19/2011 26     ALT (U/L)   Date Value   06/17/2013 22     BUN (mg/dL)   Date Value   32/01/3342 15     BP Readings from Last 3 Encounters:   12/30/13 130/60   06/24/13 134/74   01/02/13 150/64          (goal 120/80)    All Future Testing planned in [facility]  Lab Frequency Next Occurrence   Lipid Panel Once 01/11/2016   ALT Once 01/11/2016   Basic Metabolic Panel Once 01/11/2016   TSH with Reflex Once 01/11/2016       Next Visit Date:  No future appointments.         Patient Active Problem List:     Hypothyroid     Kidney stones     OA (osteoarthritis)     DDD (degenerative disc disease), cervical     Colon polyps     Increased serum lipids     Hypertension

## 2015-07-11 MED ORDER — LEVOTHYROXINE SODIUM 75 MCG PO TABS
75 MCG | ORAL_TABLET | ORAL | 0 refills | Status: DC
Start: 2015-07-11 — End: 2015-10-08

## 2015-07-11 NOTE — Telephone Encounter (Signed)
Health Maintenance   Topic Date Due   ??? HEPATITIS  C SCREENING  05/22/1946   ??? PNEUMO VAC >= 65 (1 of 2 - PCV13) 11/16/2010   ??? FLU VACCINE YEARLY (ADULT)  07/11/2015   ??? LIPIDS  06/17/2018   ??? TETANUS VACCINE ADULT (11 YEARS AND UP)  02/04/2020   ??? COLON CANCER SCREENING COLONOSCOPY  11/21/2020   ??? ZOSTAVAX VACCINE  Completed       No results found for: LABA1C          ( goal A1C is < 7)   No results found for: LABMICR  LDL CHOLESTEROL (mg/dL)   Date Value   04/54/098107/08/2013 87     LDL CALCULATED (mg/dL)   Date Value   19/14/782907/17/2013 155       (goal LDL is <100)   AST (U/L)   Date Value   11/19/2011 26     ALT (U/L)   Date Value   06/17/2013 22     BUN (mg/dL)   Date Value   56/21/308607/08/2013 15     BP Readings from Last 3 Encounters:   12/30/13 130/60   06/24/13 134/74   01/02/13 150/64          (goal 120/80)    All Future Testing planned in [facility]  Lab Frequency Next Occurrence   Lipid Panel Once 01/11/2016   ALT Once 01/11/2016   Basic Metabolic Panel Once 01/11/2016   TSH with Reflex Once 01/11/2016       Next Visit Date:  No future appointments.         Patient Active Problem List:     Hypothyroid     Kidney stones     OA (osteoarthritis)     DDD (degenerative disc disease), cervical     Colon polyps     Increased serum lipids     Hypertension

## 2015-08-29 MED ORDER — FAMOTIDINE 20 MG PO TABS
20 MG | ORAL_TABLET | ORAL | 0 refills | Status: DC
Start: 2015-08-29 — End: 2015-11-27

## 2015-08-29 NOTE — Telephone Encounter (Signed)
Health Maintenance   Topic Date Due   ??? HEPATITIS  C SCREENING  09/12/1946   ??? PNEUMO VAC >= 65 (1 of 2 - PCV13) 11/16/2010   ??? Flu Vaccine (1) 07/11/2015   ??? LIPIDS  06/17/2018   ??? DTaP/Tdap/Td vaccine (2 - Td) 02/04/2020   ??? COLON CANCER SCREENING COLONOSCOPY  11/21/2020   ??? ZOSTAVAX VACCINE  Completed       No results found for: LABA1C          ( goal A1C is < 7)   No results found for: LABMICR  LDL CHOLESTEROL (mg/dL)   Date Value   09/81/1914 87     LDL CALCULATED (mg/dL)   Date Value   78/29/5621 155       (goal LDL is <100)   AST (U/L)   Date Value   11/19/2011 26     ALT (U/L)   Date Value   06/17/2013 22     BUN (mg/dL)   Date Value   30/86/5784 15     BP Readings from Last 3 Encounters:   12/30/13 130/60   06/24/13 134/74   01/02/13 150/64          (goal 120/80)    All Future Testing planned in [facility]  Lab Frequency Next Occurrence   Lipid Panel Once 01/11/2016   ALT Once 01/11/2016   Basic Metabolic Panel Once 01/11/2016   TSH with Reflex Once 01/11/2016       Next Visit Date:  No future appointments.         Patient Active Problem List:     Hypothyroid     Kidney stones     OA (osteoarthritis)     DDD (degenerative disc disease), cervical     Colon polyps     Increased serum lipids     Hypertension

## 2015-10-10 MED ORDER — LEVOTHYROXINE SODIUM 75 MCG PO TABS
75 MCG | ORAL_TABLET | ORAL | 0 refills | Status: DC
Start: 2015-10-10 — End: 2016-01-06

## 2015-10-10 NOTE — Telephone Encounter (Signed)
Health Maintenance   Topic Date Due   ??? Hepatitis C screen  [birth year]   ??? PNEUMO VAC >= 65 (1 of 2 - PCV13) 11/16/2010   ??? Flu vaccine (1) 07/11/2015   ??? Lipid screen  06/17/2018   ??? DTaP/Tdap/Td vaccine (2 - Td) 02/04/2020   ??? Colon cancer screen colonoscopy  11/21/2020   ??? Zostavax vaccine  Completed       No results found for: LABA1C          ( goal A1C is < 7)   No results found for: LABMICR  LDL CHOLESTEROL (mg/dL)   Date Value   29/56/213007/08/2013 87     LDL CALCULATED (mg/dL)   Date Value   86/57/846907/17/2013 155       (goal LDL is <100)   AST (U/L)   Date Value   11/19/2011 26     ALT (U/L)   Date Value   06/17/2013 22     BUN (mg/dL)   Date Value   62/95/284107/08/2013 15     BP Readings from Last 3 Encounters:   12/30/13 130/60   06/24/13 134/74   01/02/13 150/64          (goal 120/80)    All Future Testing planned in [facility]  Lab Frequency Next Occurrence   Lipid Panel Once 01/11/2016   ALT Once 01/11/2016   Basic Metabolic Panel Once 01/11/2016   TSH with Reflex Once 01/11/2016       Next Visit Date:  No future appointments.         Patient Active Problem List:     Hypothyroid     Kidney stones     OA (osteoarthritis)     DDD (degenerative disc disease), cervical     Colon polyps     Increased serum lipids     Hypertension

## 2015-11-28 MED ORDER — FAMOTIDINE 20 MG PO TABS
20 MG | ORAL_TABLET | Freq: Two times a day (BID) | ORAL | 0 refills | Status: DC
Start: 2015-11-28 — End: 2016-02-25

## 2015-11-28 NOTE — Telephone Encounter (Signed)
Health Maintenance   Topic Date Due   ??? Hepatitis C screen  1946-10-25   ??? Pneumococcal low/med risk (1 of 2 - PCV13) 11/16/2010   ??? Flu vaccine (1) 07/11/2015   ??? Lipid screen  06/17/2018   ??? DTaP/Tdap/Td vaccine (2 - Td) 02/04/2020   ??? Colon cancer screen colonoscopy  11/21/2020   ??? Zostavax vaccine  Completed       No results found for: LABA1C          ( goal A1C is < 7)   No results found for: LABMICR  LDL CHOLESTEROL (mg/dL)   Date Value   96/04/540907/08/2013 87     LDL CALCULATED (mg/dL)   Date Value   81/19/147807/17/2013 155       (goal LDL is <100)   AST (U/L)   Date Value   11/19/2011 26     ALT (U/L)   Date Value   06/17/2013 22     BUN (mg/dL)   Date Value   29/56/213007/08/2013 15     BP Readings from Last 3 Encounters:   12/30/13 130/60   06/24/13 134/74   01/02/13 150/64          (goal 120/80)    All Future Testing planned in [facility]  Lab Frequency Next Occurrence   Lipid Panel Once 01/11/2016   ALT Once 01/11/2016   Basic Metabolic Panel Once 01/11/2016   TSH with Reflex Once 01/11/2016       Next Visit Date:  No future appointments.         Patient Active Problem List:     Hypothyroid     Kidney stones     OA (osteoarthritis)     DDD (degenerative disc disease), cervical     Colon polyps     Increased serum lipids     Hypertension

## 2016-01-06 MED ORDER — LEVOTHYROXINE SODIUM 75 MCG PO TABS
75 MCG | ORAL_TABLET | ORAL | 0 refills | Status: AC
Start: 2016-01-06 — End: ?

## 2016-01-06 NOTE — Telephone Encounter (Signed)
Health Maintenance   Topic Date Due   ??? Hepatitis C screen  December 30, 1945   ??? Pneumococcal low/med risk (1 of 2 - PCV13) 11/16/2010   ??? Flu vaccine (1) 07/11/2015   ??? Lipid screen  06/17/2018   ??? DTaP/Tdap/Td vaccine (2 - Td) 02/04/2020   ??? Colon cancer screen colonoscopy  11/21/2020   ??? Zostavax vaccine  Completed       No results found for: LABA1C          ( goal A1C is < 7)   No results found for: LABMICR  LDL Cholesterol (mg/dL)   Date Value   16/09/9603 87     LDL Calculated (mg/dL)   Date Value   54/08/8118 155       (goal LDL is <100)   AST (U/L)   Date Value   11/19/2011 26     ALT (U/L)   Date Value   06/17/2013 22     BUN (mg/dL)   Date Value   14/78/2956 15     BP Readings from Last 3 Encounters:   12/30/13 130/60   06/24/13 134/74   01/02/13 150/64          (goal 120/80)    All Future Testing planned in [facility]  Lab Frequency Next Occurrence   Lipid Panel Once 01/11/2016   ALT Once 01/11/2016   Basic Metabolic Panel Once 01/11/2016   TSH with Reflex Once 01/11/2016       Next Visit Date:  No future appointments.         Patient Active Problem List:     Hypothyroid     Kidney stones     OA (osteoarthritis)     DDD (degenerative disc disease), cervical     Colon polyps     Increased serum lipids     Hypertension

## 2016-01-19 ENCOUNTER — Encounter

## 2016-02-27 NOTE — Telephone Encounter (Signed)
Health Maintenance   Topic Date Due   ??? Hepatitis C screen  21-Jun-1946   ??? Pneumococcal low/med risk (1 of 2 - PCV13) 11/16/2010   ??? Flu vaccine (1) 07/11/2015   ??? Lipid screen  06/17/2018   ??? DTaP/Tdap/Td vaccine (2 - Td) 02/04/2020   ??? Colon cancer screen colonoscopy  11/21/2020   ??? Zostavax vaccine  Completed       No results found for: LABA1C          ( goal A1C is < 7)   No results found for: LABMICR  LDL Cholesterol (mg/dL)   Date Value   16/10/960407/08/2013 87     LDL Calculated (mg/dL)   Date Value   54/09/811907/17/2013 155       (goal LDL is <100)   AST (U/L)   Date Value   11/19/2011 26     ALT (U/L)   Date Value   06/17/2013 22     BUN (mg/dL)   Date Value   14/78/295607/08/2013 15     BP Readings from Last 3 Encounters:   12/30/13 130/60   06/24/13 134/74   01/02/13 150/64          (goal 120/80)    All Future Testing planned in [facility]  Lab Frequency Next Occurrence   Basic Metabolic Panel Once 04/25/2016   TSH With Reflex Ft4 Once 04/25/2016   Lipid Panel Once 04/25/2016   ALT Once 04/25/2016       Next Visit Date:  No future appointments.         Patient Active Problem List:     Hypothyroid     Kidney stones     OA (osteoarthritis)     DDD (degenerative disc disease), cervical     Colon polyps     Increased serum lipids     Hypertension

## 2016-02-28 MED ORDER — FAMOTIDINE 20 MG PO TABS
20 MG | ORAL_TABLET | ORAL | 0 refills | Status: AC
Start: 2016-02-28 — End: ?

## 2020-04-28 IMAGING — CT CT LUMBAR SPINE WITHOUT CONTRAST
3 of 5 series · 10 of 33 positions shown, 11 images · non-contrast
Comparison: There are no previous exams available for comparison.

CT LUMBAR SPINE WITHOUT CONTRAST, CT THORACIC SPINE WITHOUT CONTRAST, 04/28/2020 
[DATE]: 
CLINICAL INDICATION: Thoracolumbar radiculopathy, back pain 
A search for DICOM formatted images was conducted for prior CT imaging studies 
completed at a non-affiliated media free facility.
TECHNIQUE: The lumbar spine was scanned from T12 through mid-sacrum without 
contrast on a high-resolution CT scanner using dose reduction techniques. 
RoutIne MPR reconstructions were performed.

[Series 6: l spine 2.0 b31s · axial · 0.33mm/px · z∈[-542,-442]mm · 2 of 152 slices shown, 3 images]
[im 51/152  soft-tissue]
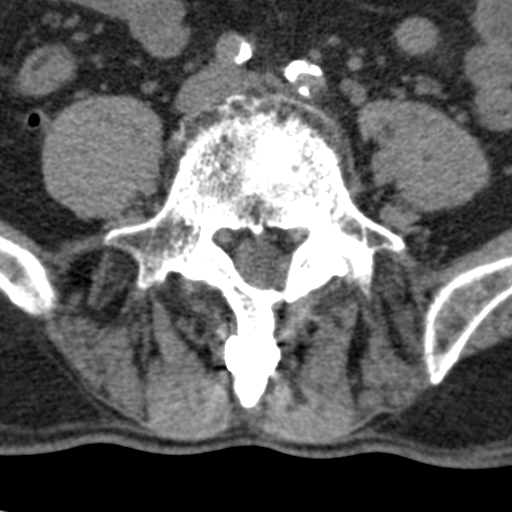
[im 51/152  bone]
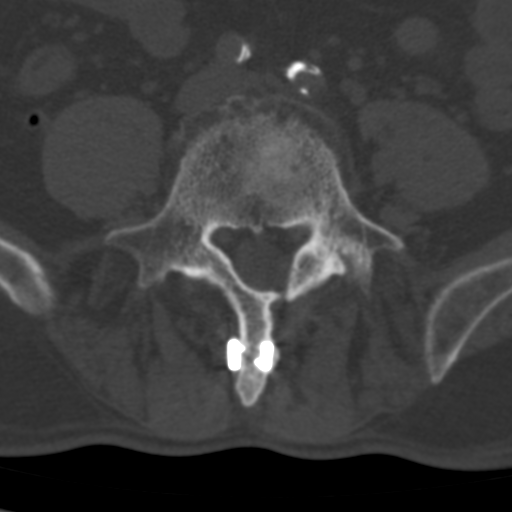
[im 101/152  bone]
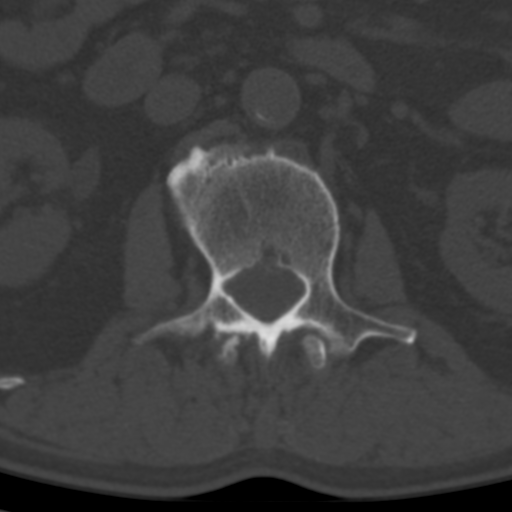

[Series 12: coronal · coronal · 0.34mm/px · 3 of 82 slices shown]
[im 17/82  bone]
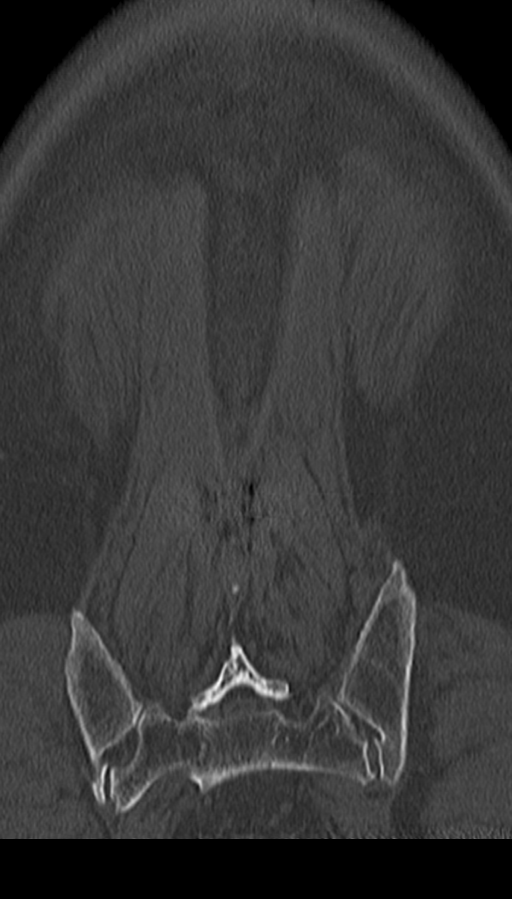
[im 33/82  bone]
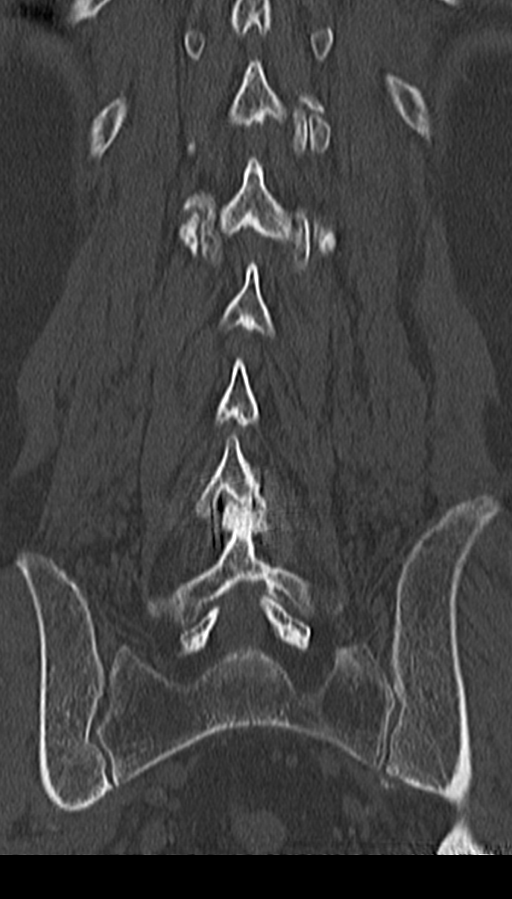
[im 49/82  bone]
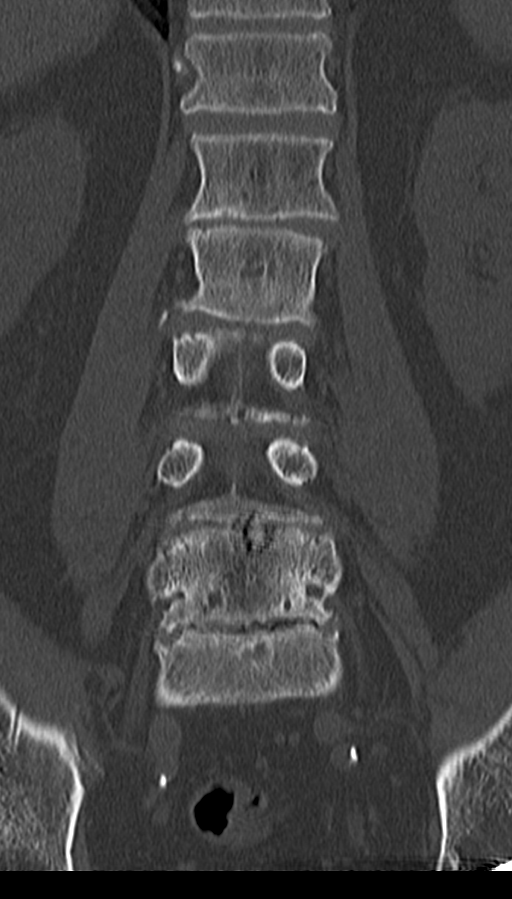

[Series 13: sagittal bone · sagittal · 0.33mm/px · 5 of 93 slices shown]
[im 16/93  bone]
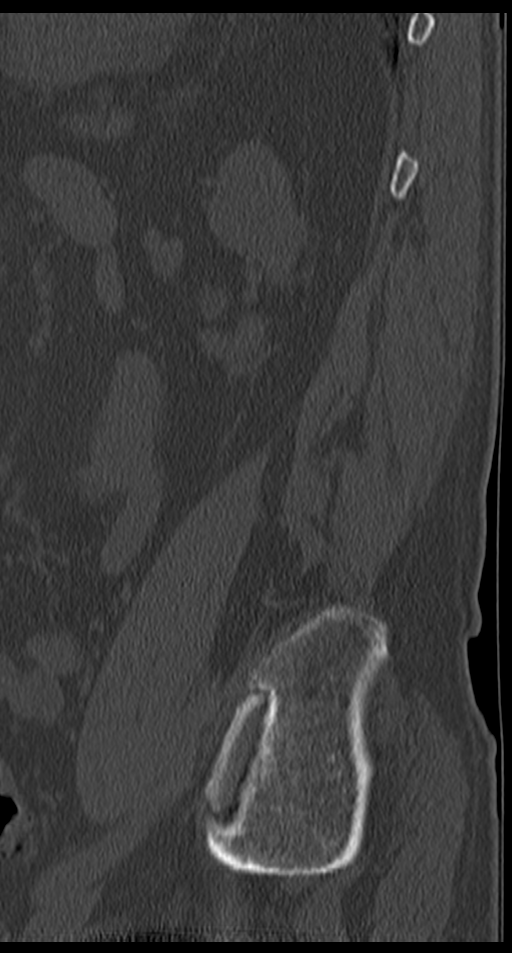
[im 31/93  bone]
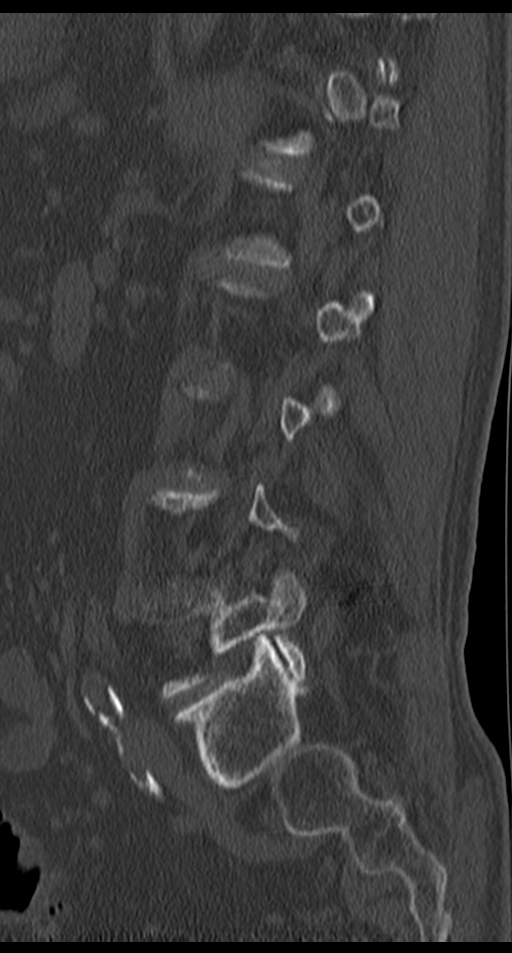
[im 47/93  bone]
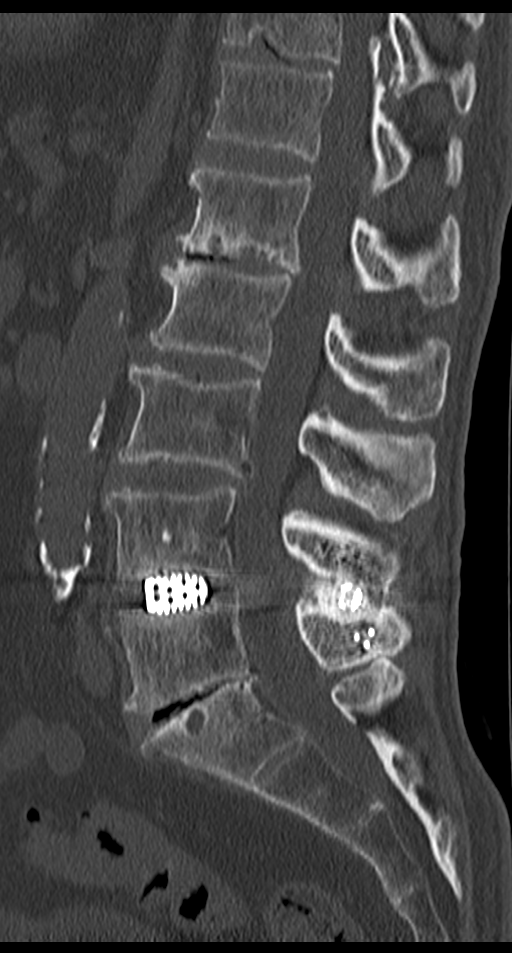
[im 62/93  bone]
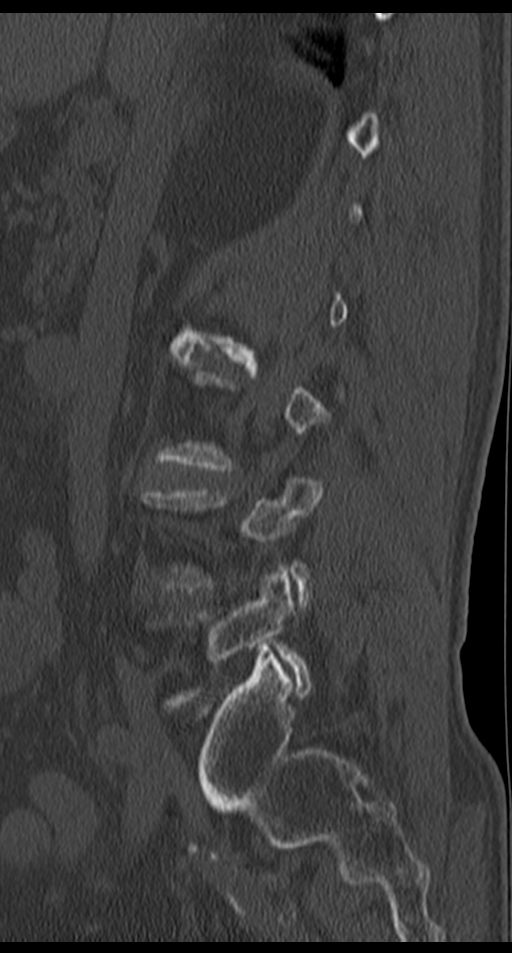
[im 77/93  bone]
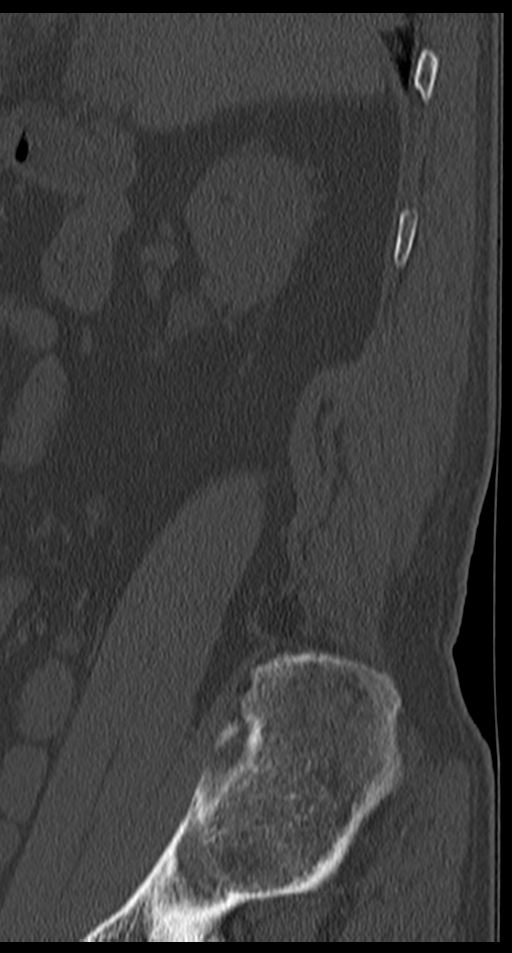

[10 of 33 positions shown; findings below may reference images not displayed]

FINDINGS: Lumbar and thoracic vertebral heights are intact. There are chronic 
Schmorl's nodes at several levels, most conspicuous at L1-2 and along the 
inferior T11 endplate. There is no evidence for compression fracture. No 
aggressive bone destruction to indicate malignancy. 
Patient is status post L4-5 interbody disc cage fusion, with spinous process 
clamp. Hardware appears appropriately positioned. 
At L5-S1 there is mild posterior endplate osteophyte, without canal stenosis. 
There is marked disc narrowing at this level with mild to moderate bilateral 
foraminal stenosis encroaching on the distal L5 nerve roots. 
At L4-5 metal artifact limits detail but the canal appears open. There has been 
left laminectomy. There is mild bilateral foraminal narrowing. 
At L3-4 there is mild posterior disc bulge and endplate osteophyte, with 
moderate facet and ligamentous hypertrophy contributing to mild-to-moderate 
canal stenosis, axial image 77. There is mild left foraminal stenosis, right 
foramen open. 
At L2-3 the canal and foramina remain open. 
At L1-2 there is mild canal stenosis due to disc bulge and moderate facet change 
with ligamentous thickening, axial image 44. There appears to be a right far 
lateral disc bulge encroaching on the right foramen and distal right L1 nerve 
root/ganglion, best seen on axial image 44. Left foramen is open. 
At T12-L1 and T11-12 the canal is open. There is marked disc narrowing with 
endplate irregularity from a T8-T12. There is no evidence for thoracic disc 
herniation or significant thoracic stenosis.
IMPRESSION: Status post L4-5 interbody disc cage fusion and spinous process clamp. Hardware 
is well positioned. There has been left laminectomy. The canal is open at this 
level with mild bilateral foraminal stenosis. 
Mild to moderate canal stenosis at L3-4 encroaching on the exiting L4 nerve 
roots. 
At L5-S1 there is marked disc narrowing with mild to moderate bilateral 
foraminal stenosis encroaching on the distal L5 nerve roots. 
Mild canal stenosis at L1-2. Right far lateral disc bulge at L1 to encroach is 
on the right foramen and distal right L1 nerve root/ganglion . 
Lower thoracic degenerative changes with marked disc narrowing and endplate 
irregularity and Schmorl's nodes. There is no significant thoracic stenosis. 
There is no evidence for thoracic or lumbar fracture. No aggressive bone 
destruction to suggest malignancy identified. 
Moderate aortoiliac plaque. No evidence for aortic aneurysm. 
RADIATION DOSE REDUCTION: All CT scans are performed using radiation dose 
reduction techniques, when applicable.  Technical factors are evaluated and 
adjusted to ensure appropriate moderation of exposure.  Automated dose 
management technology is applied to adjust the radiation doses to minimize 
exposure while achieving diagnostic quality images.

## 2020-04-28 IMAGING — DX LUMBAR SPINE AP & LAT WITH FLEXION AND EXTENSION
1 series · 4 of 4 positions shown · non-contrast
Comparison: Today's thoracic and lumbar CT

LUMBAR SPINE AP \T\T\T\ LAT WITH FLEXION AND EXTENSION, 04/28/2020 [DATE]: 
CLINICAL INDICATION: Low back pain

[Series 1: AP · U · 0.17mm/px · 4 of 4 slices shown]
[im 1/4]
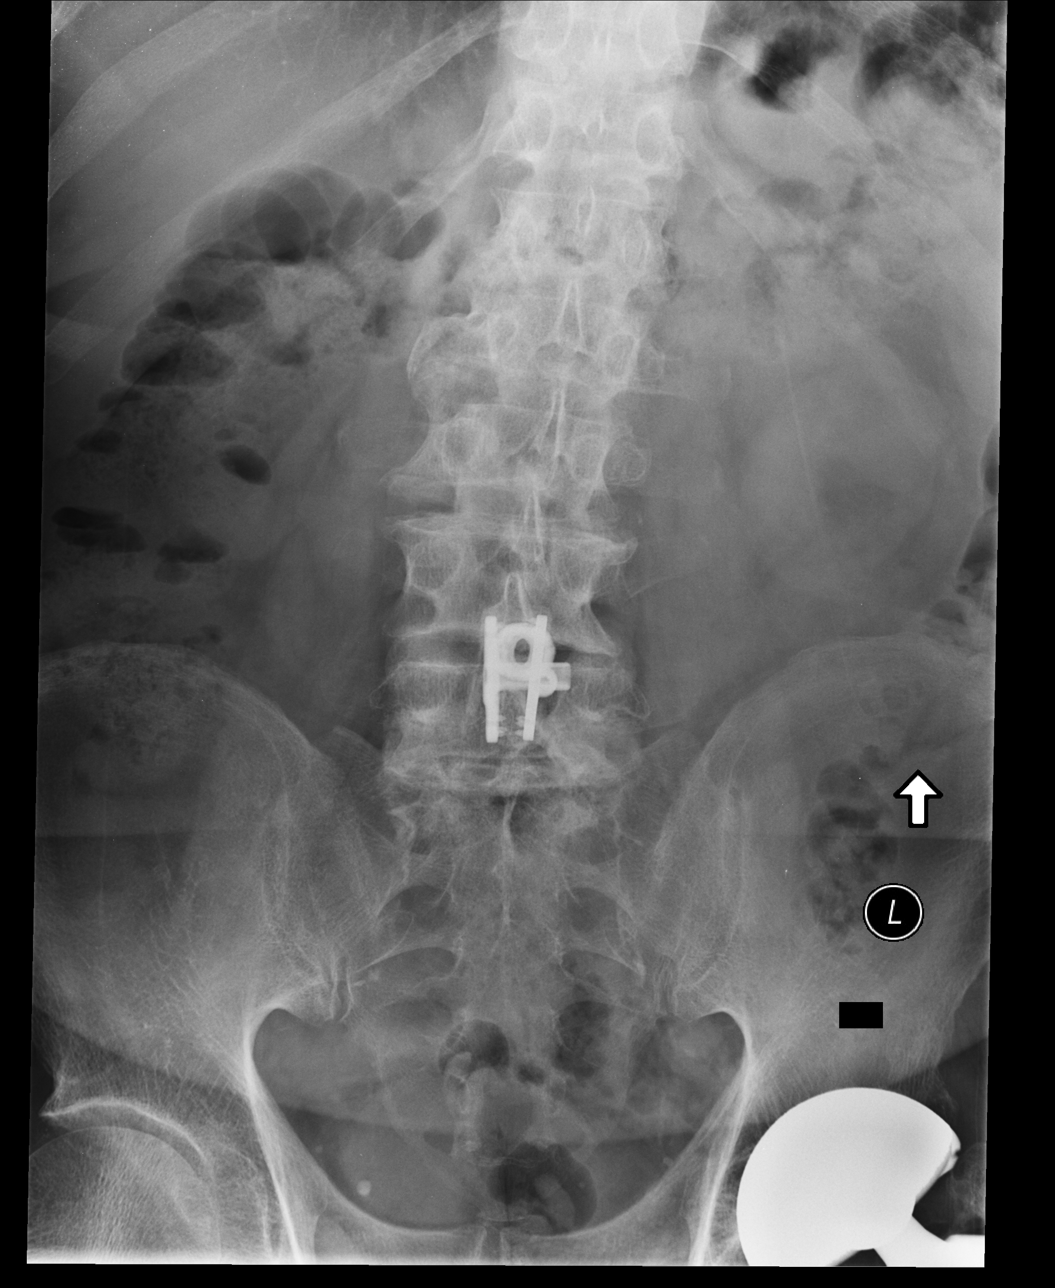
[im 2/4]
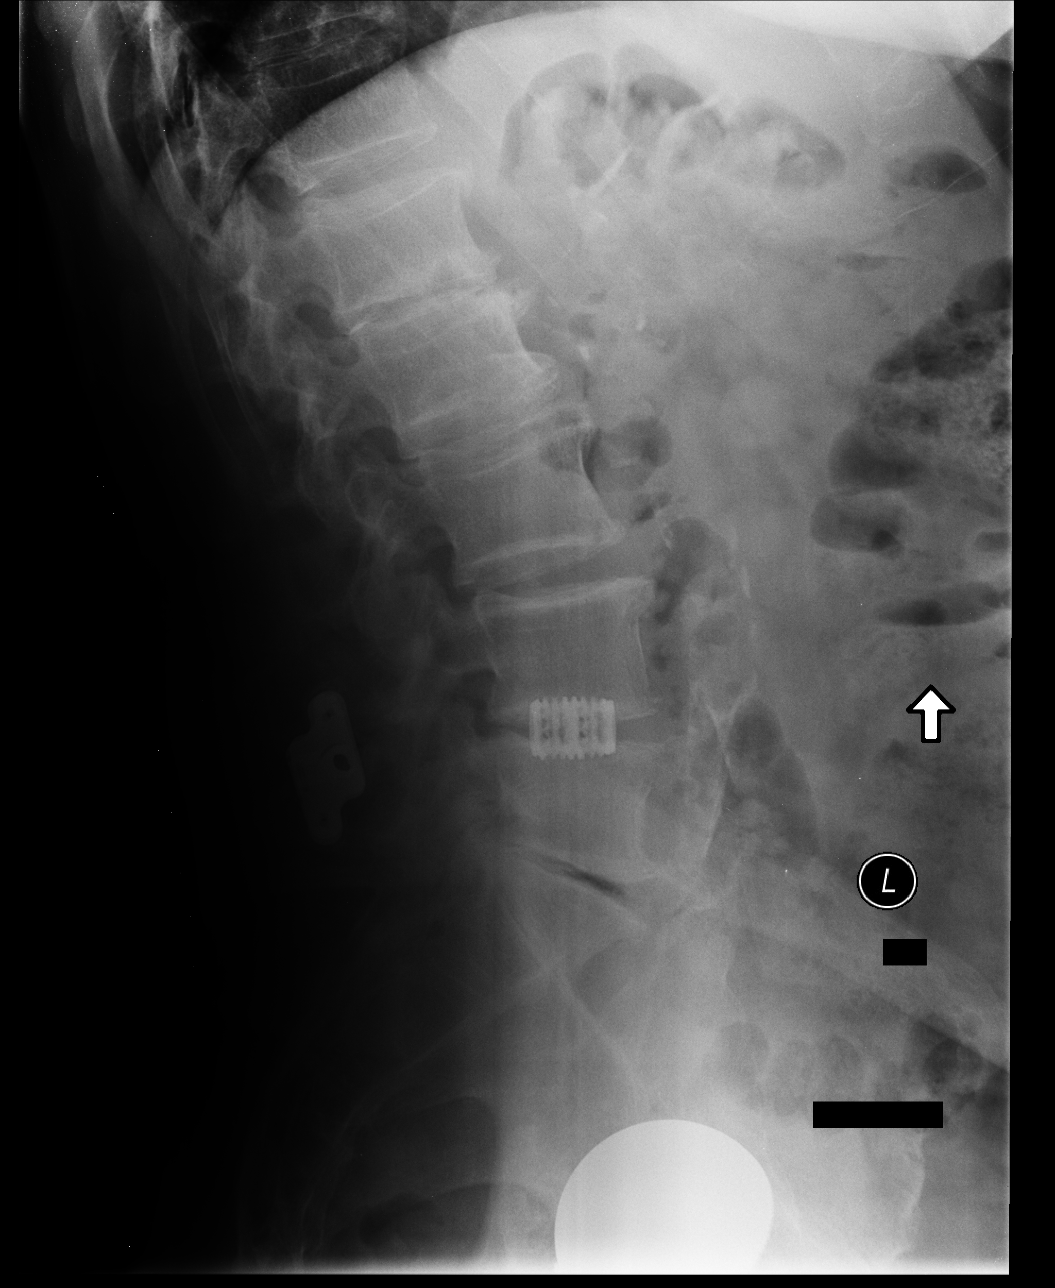
[im 3/4]
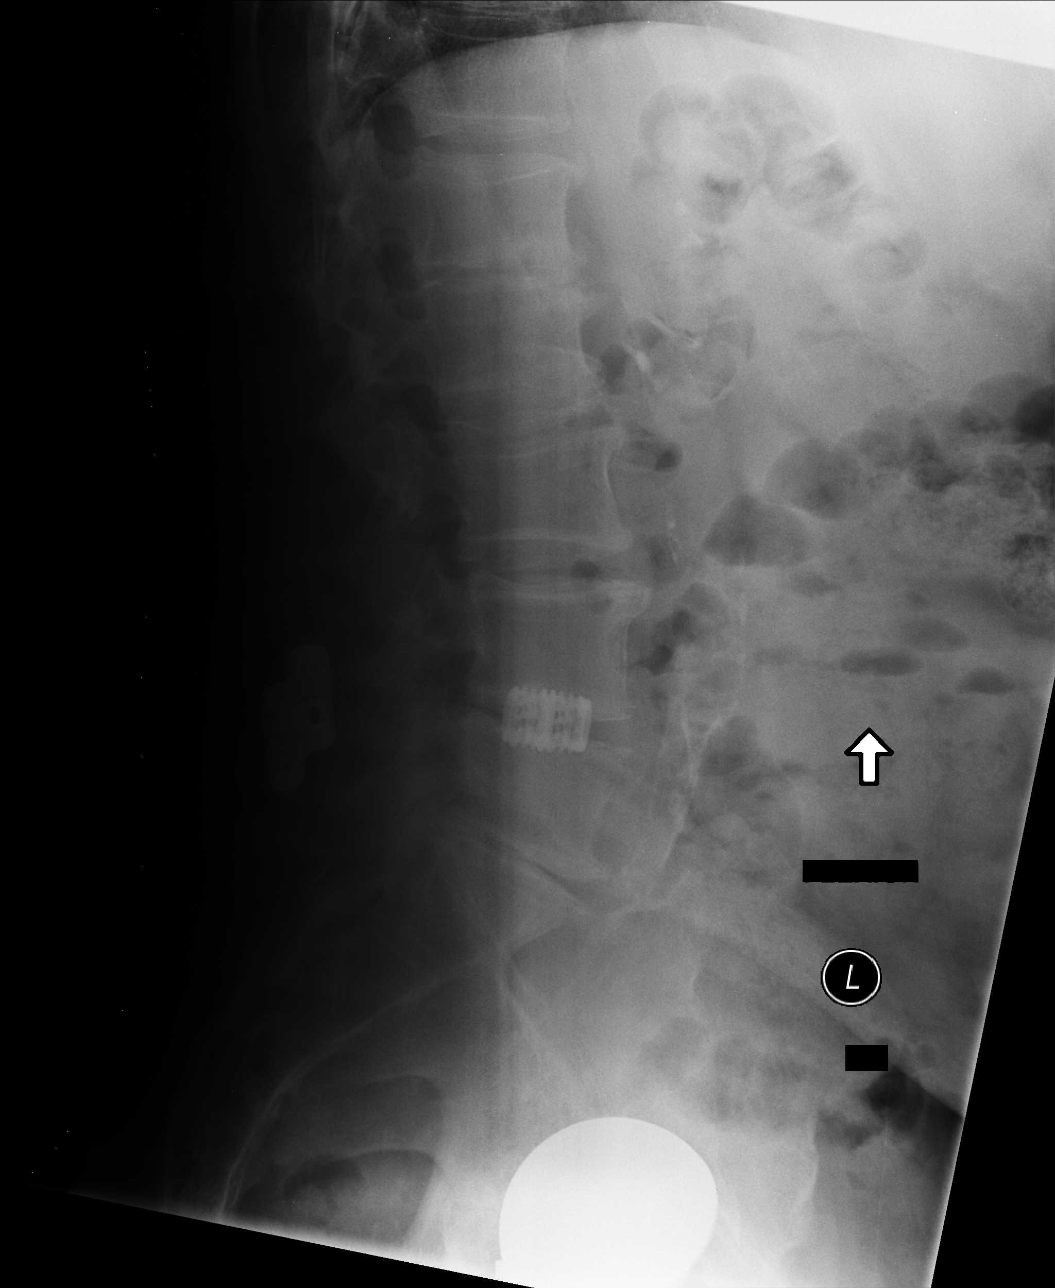
[im 4/4]
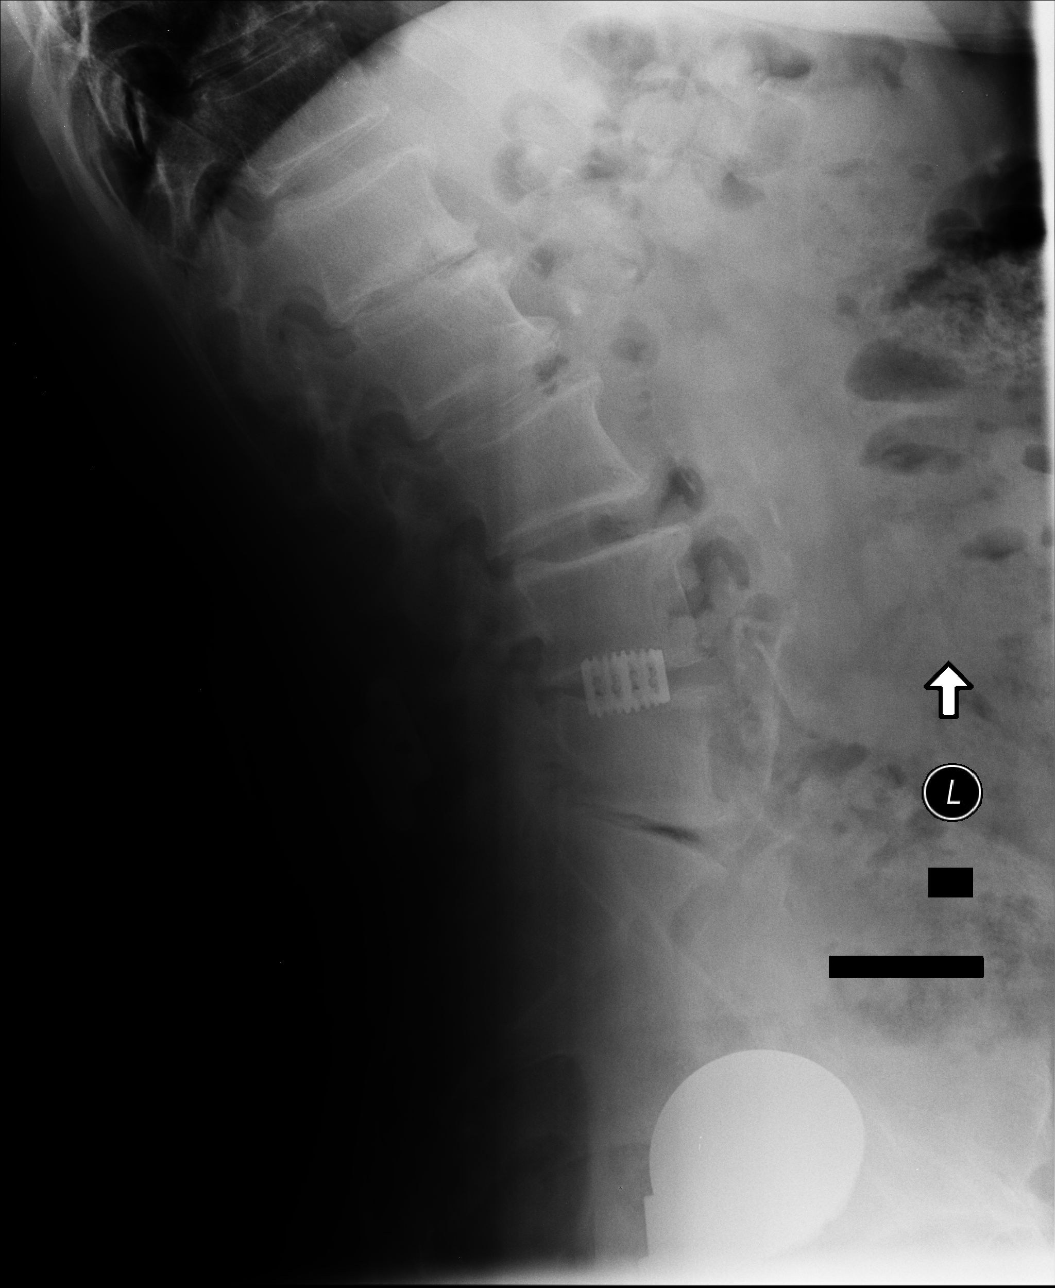

[4 of 4 positions shown; findings below may reference images not displayed]

FINDINGS: Patient is status post L4-5 interbody disc cage and spinous process clamp. The 
neutral lateral view shows no significant listhesis. Flexion-extension views 
show no dynamic subluxation or hardware mobility at L4-5. There is 2 mm 
retrolisthesis at L3-4 and extension, reducing in flexion. Marked disc narrowing 
at L5-S1 with vacuum phenomenon. There is mild lumbar dextroscoliosis. There is 
left hip prosthesis.
IMPRESSION: No evidence for hardware mobility at L4-5. 
Slight retrolisthesis at L3-4 and extension, reducing in flexion.

## 2020-04-28 IMAGING — CT CT THORACIC SPINE WITHOUT CONTRAST
3 series · 10 of 33 positions shown, 11 images · non-contrast
Comparison: There are no previous exams available for comparison.

CT LUMBAR SPINE WITHOUT CONTRAST, CT THORACIC SPINE WITHOUT CONTRAST, 04/28/2020 
[DATE]: 
CLINICAL INDICATION: Thoracolumbar radiculopathy, back pain 
A search for DICOM formatted images was conducted for prior CT imaging studies 
completed at a non-affiliated media free facility.
TECHNIQUE: The lumbar spine was scanned from T12 through mid-sacrum without 
contrast on a high-resolution CT scanner using dose reduction techniques. 
RoutIne MPR reconstructions were performed.

[Series 5: t spine 2.0 b31s · axial · 0.38mm/px · z∈[-304,-152]mm · 2 of 167 slices shown, 3 images]
[im 52/167  soft-tissue]
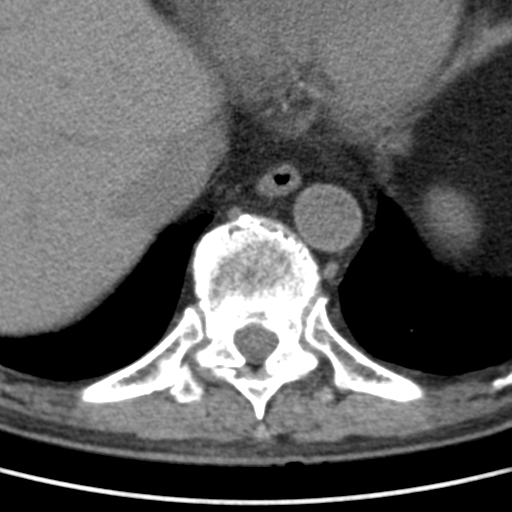
[im 52/167  bone]
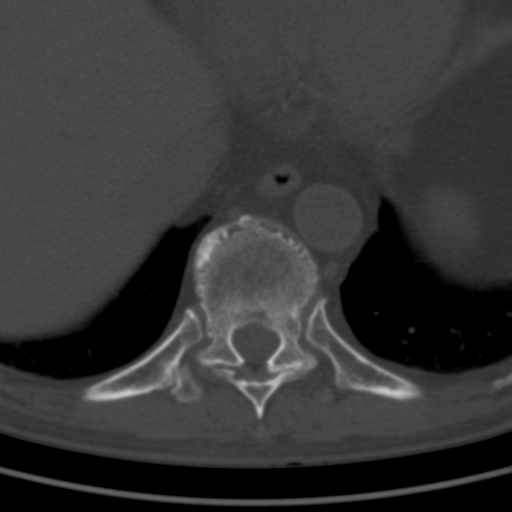
[im 128/167  bone]
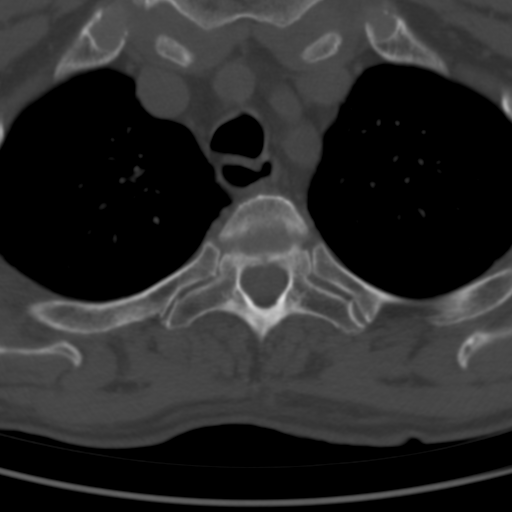

[Series 8: coronal · coronal · 0.33mm/px · 3 of 79 slices shown]
[im 16/79  bone]
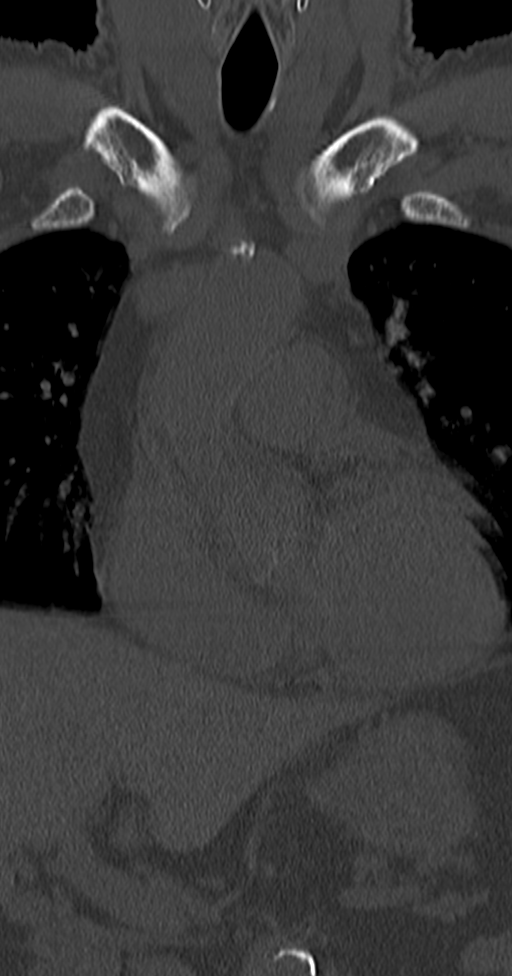
[im 32/79  bone]
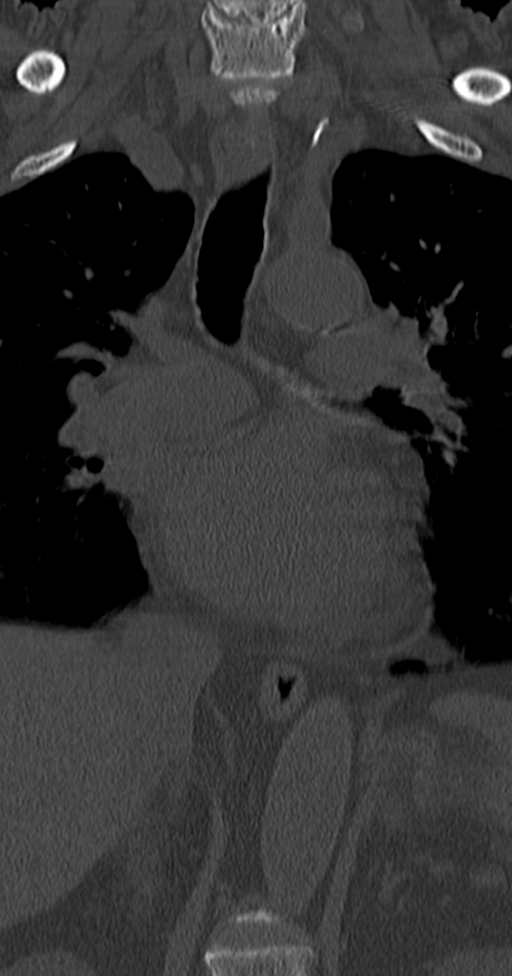
[im 47/79  bone]
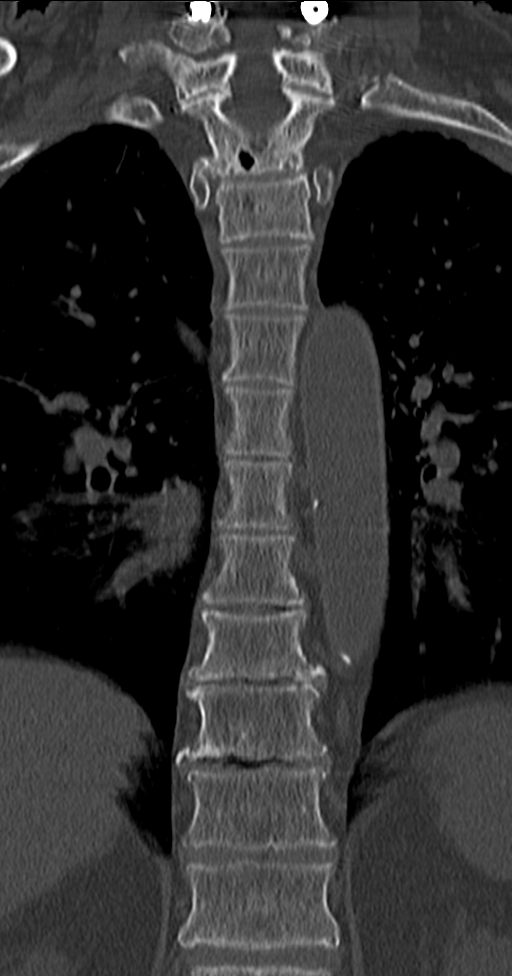

[Series 10: sagittal st · sagittal · 0.37mm/px · 5 of 109 slices shown]
[im 37/109  bone]
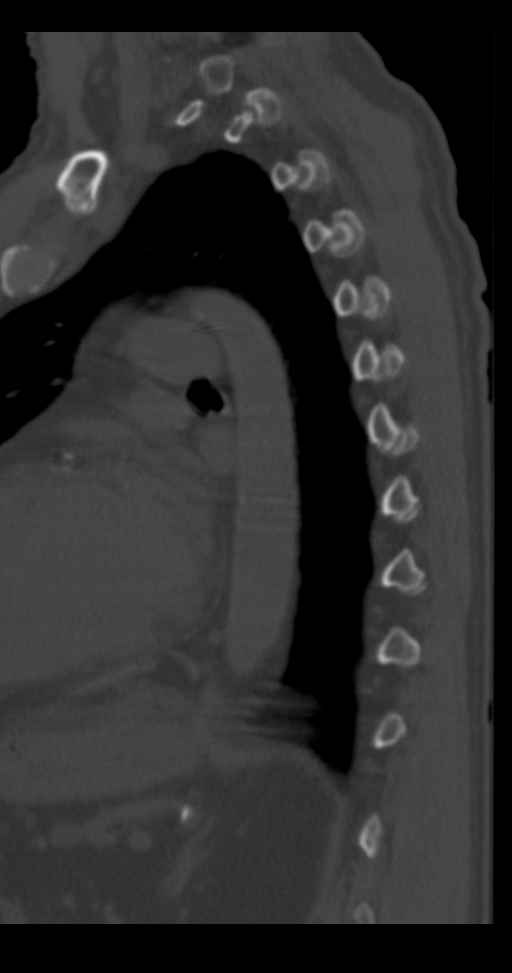
[im 46/109  bone]
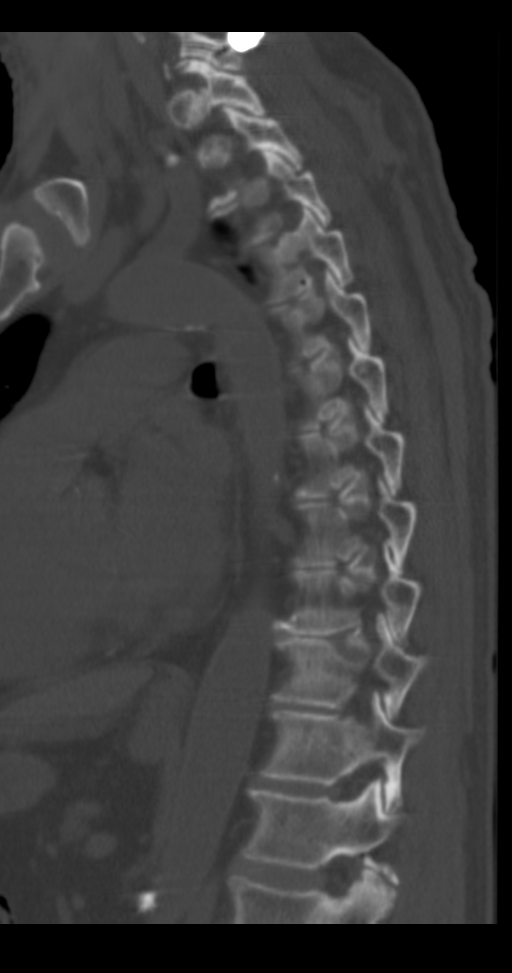
[im 55/109  bone]
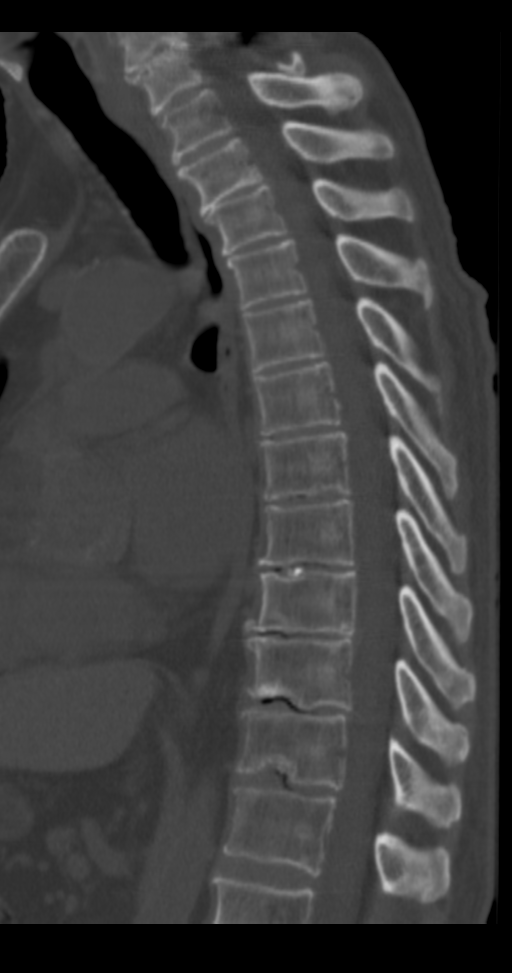
[im 64/109  bone]
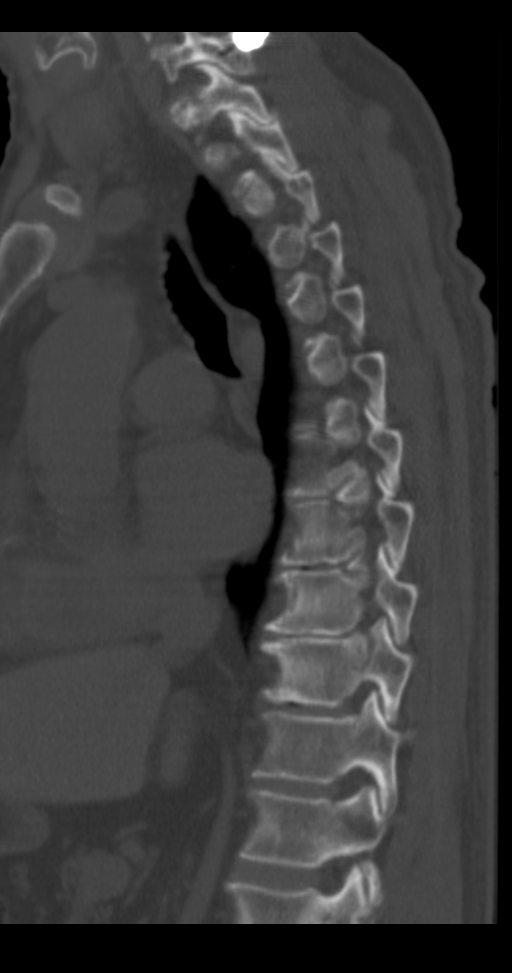
[im 73/109  bone]
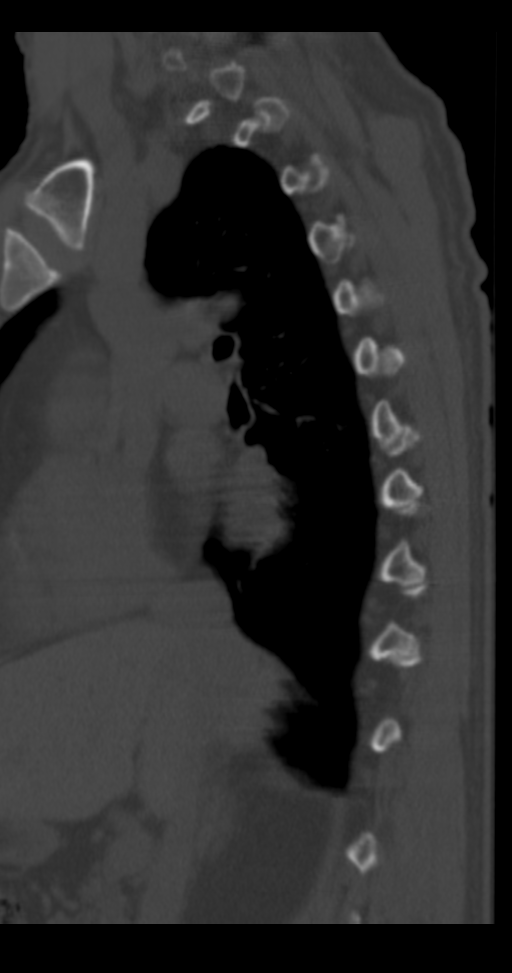

[10 of 33 positions shown; findings below may reference images not displayed]

FINDINGS: Lumbar and thoracic vertebral heights are intact. There are chronic 
Schmorl's nodes at several levels, most conspicuous at L1-2 and along the 
inferior T11 endplate. There is no evidence for compression fracture. No 
aggressive bone destruction to indicate malignancy. 
Patient is status post L4-5 interbody disc cage fusion, with spinous process 
clamp. Hardware appears appropriately positioned. 
At L5-S1 there is mild posterior endplate osteophyte, without canal stenosis. 
There is marked disc narrowing at this level with mild to moderate bilateral 
foraminal stenosis encroaching on the distal L5 nerve roots. 
At L4-5 metal artifact limits detail but the canal appears open. There has been 
left laminectomy. There is mild bilateral foraminal narrowing. 
At L3-4 there is mild posterior disc bulge and endplate osteophyte, with 
moderate facet and ligamentous hypertrophy contributing to mild-to-moderate 
canal stenosis, axial image 77. There is mild left foraminal stenosis, right 
foramen open. 
At L2-3 the canal and foramina remain open. 
At L1-2 there is mild canal stenosis due to disc bulge and moderate facet change 
with ligamentous thickening, axial image 44. There appears to be a right far 
lateral disc bulge encroaching on the right foramen and distal right L1 nerve 
root/ganglion, best seen on axial image 44. Left foramen is open. 
At T12-L1 and T11-12 the canal is open. There is marked disc narrowing with 
endplate irregularity from a T8-T12. There is no evidence for thoracic disc 
herniation or significant thoracic stenosis.
IMPRESSION: Status post L4-5 interbody disc cage fusion and spinous process clamp. Hardware 
is well positioned. There has been left laminectomy. The canal is open at this 
level with mild bilateral foraminal stenosis. 
Mild to moderate canal stenosis at L3-4 encroaching on the exiting L4 nerve 
roots. 
At L5-S1 there is marked disc narrowing with mild to moderate bilateral 
foraminal stenosis encroaching on the distal L5 nerve roots. 
Mild canal stenosis at L1-2. Right far lateral disc bulge at L1 to encroach is 
on the right foramen and distal right L1 nerve root/ganglion . 
Lower thoracic degenerative changes with marked disc narrowing and endplate 
irregularity and Schmorl's nodes. There is no significant thoracic stenosis. 
There is no evidence for thoracic or lumbar fracture. No aggressive bone 
destruction to suggest malignancy identified. 
Moderate aortoiliac plaque. No evidence for aortic aneurysm. 
RADIATION DOSE REDUCTION: All CT scans are performed using radiation dose 
reduction techniques, when applicable.  Technical factors are evaluated and 
adjusted to ensure appropriate moderation of exposure.  Automated dose 
management technology is applied to adjust the radiation doses to minimize 
exposure while achieving diagnostic quality images.

## 2020-06-14 IMAGING — CT CT CERVICAL SPINE WITHOUT CONTRAST
3 series · 12 of 33 positions shown, 14 images · non-contrast
Comparison: There are no previous exams available for comparison. No prior 
cervical examination

CT CERVICAL SPINE WITHOUT CONTRAST, 06/14/2020 [DATE]: 
CLINICAL INDICATION: Cervical fusion. For prior fusions, most recent 1441. Neck 
pain with right arm dysesthesia. 
A search for DICOM formatted images was conducted for prior CT imaging studies 
completed at a non-affiliated media free facility.
TECHNIQUE: The cervical spine was scanned from the skull base through T1 
vertebra without contrast on a high-resolution CT scanner using dose reduction 
techniques. Routing MPR reconstructions were performed.

[Series 3: c_spine 2.0 b31s · axial · 0.31mm/px · z∈[-238,-90]mm · 4 of 108 slices shown, 5 images]
[im 17/108  soft-tissue]
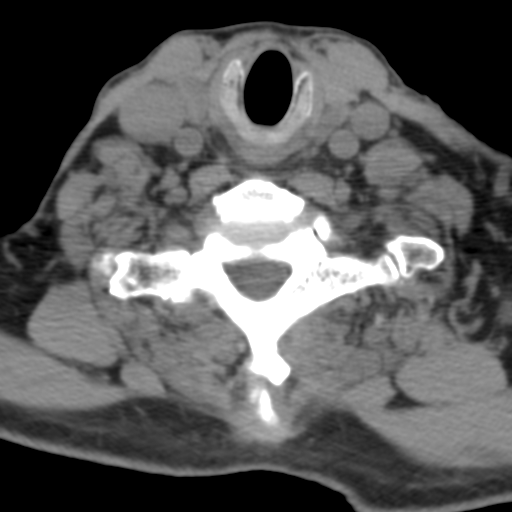
[im 17/108  bone]
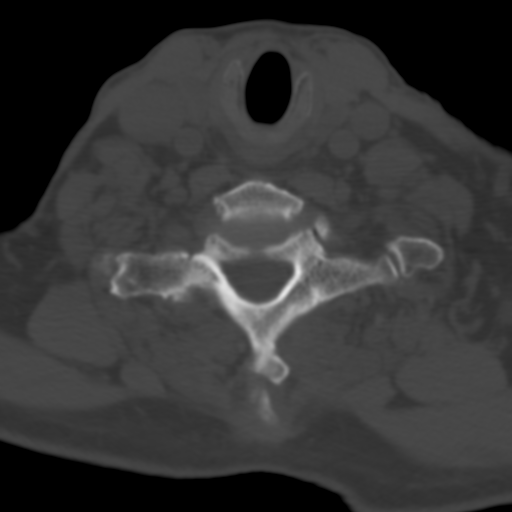
[im 42/108  bone]
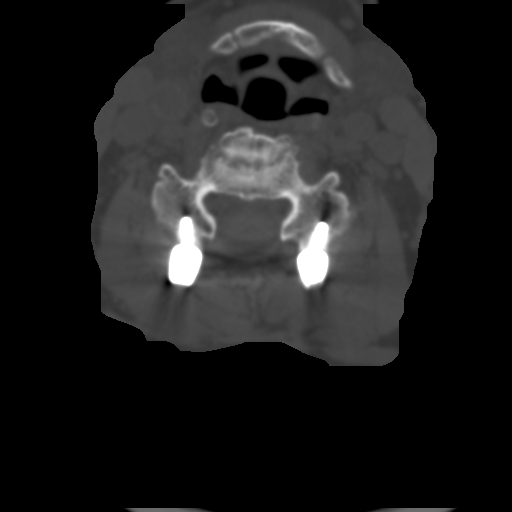
[im 66/108  bone]
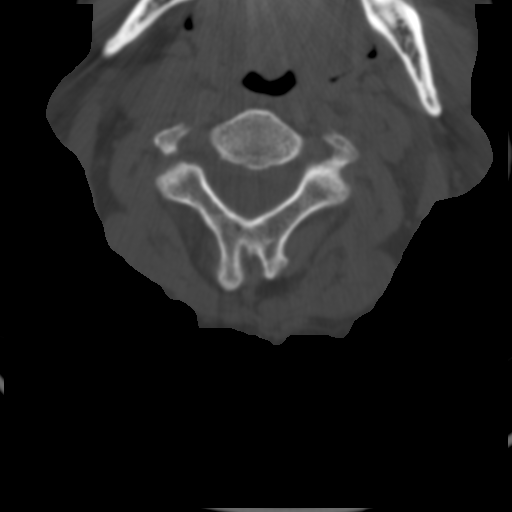
[im 91/108  bone]
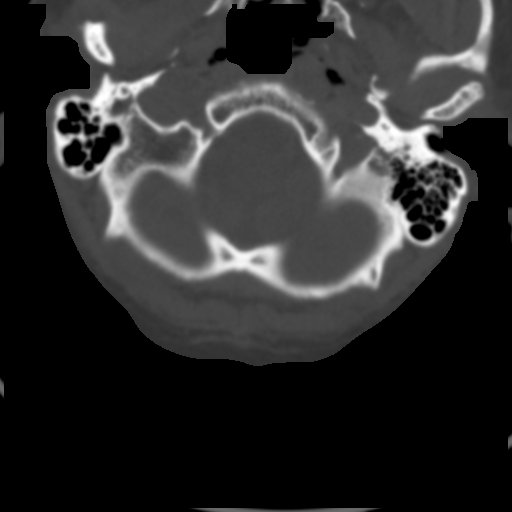

[Series 5: coronal · coronal · 0.33mm/px · 3 of 76 slices shown]
[im 16/76  bone]
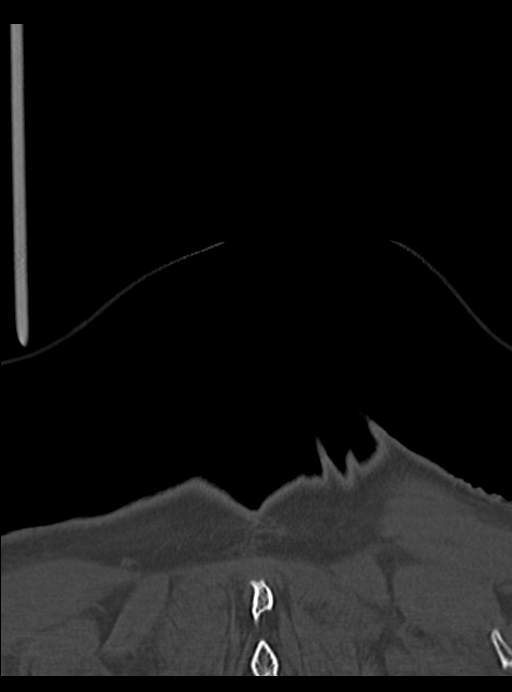
[im 31/76  bone]
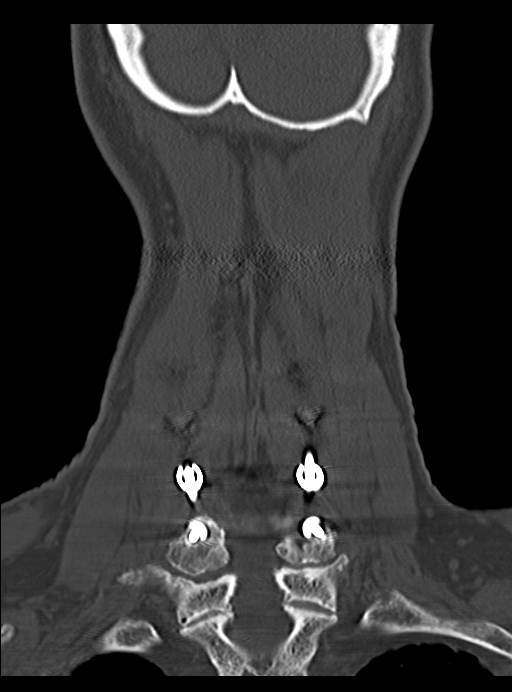
[im 46/76  bone]
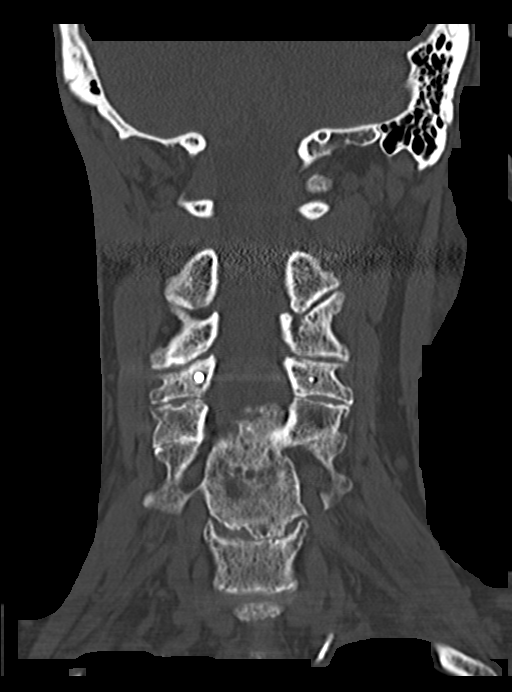

[Series 7: sagittal st · sagittal · 0.34mm/px · 5 of 78 slices shown, 6 images]
[im 26/78  bone]
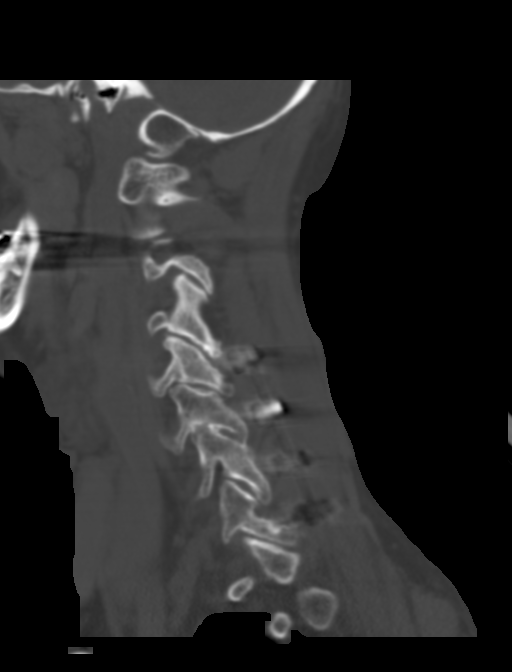
[im 33/78  bone]
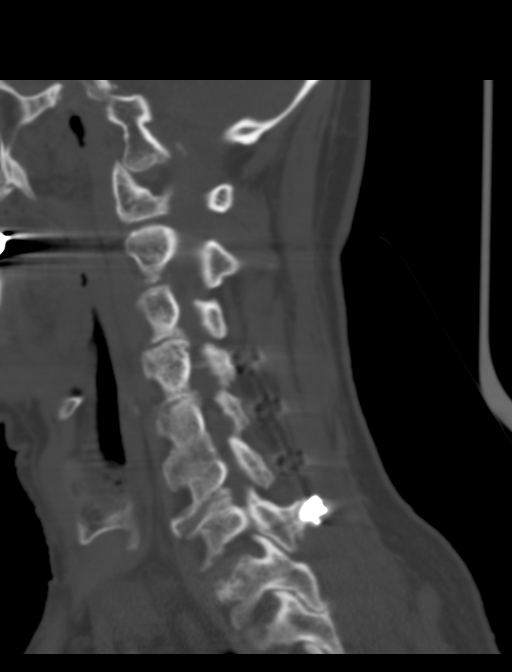
[im 39/78  soft-tissue]
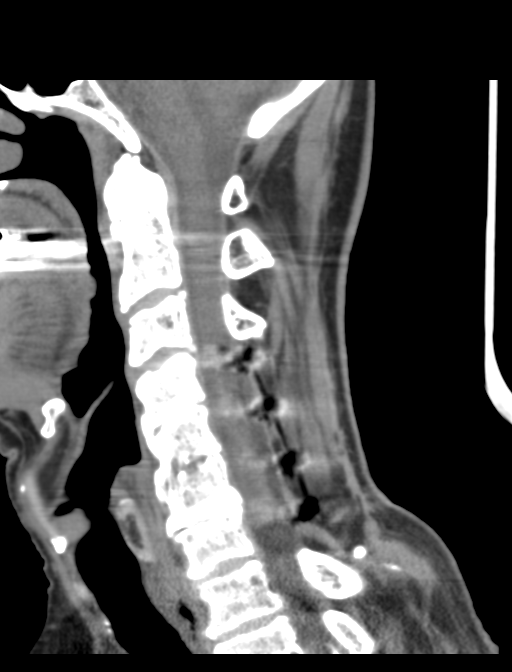
[im 39/78  bone]
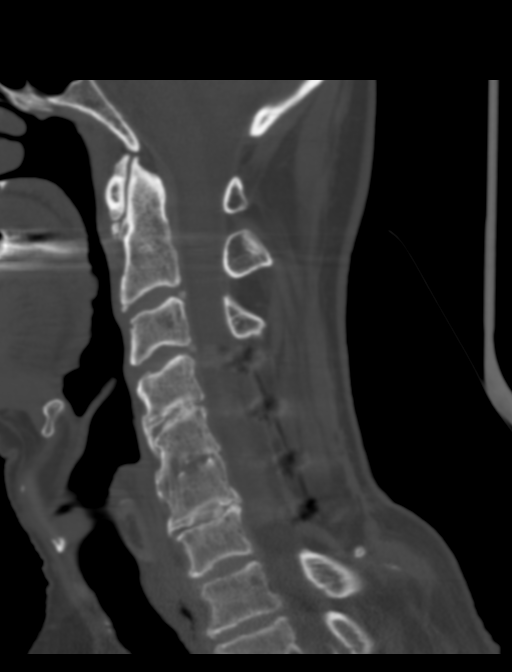
[im 45/78  bone]
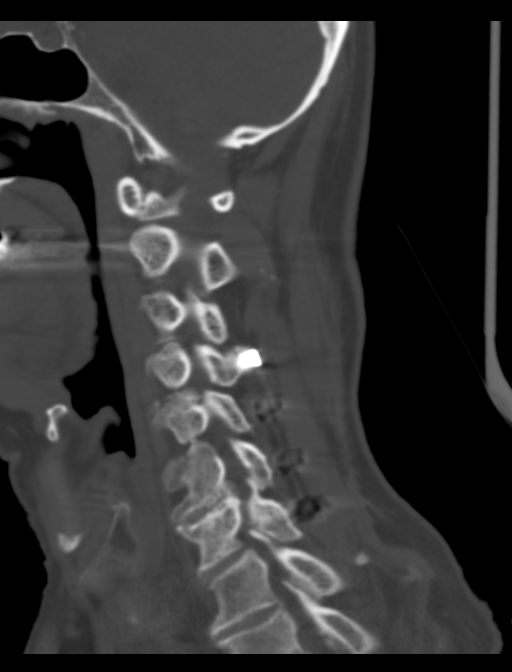
[im 52/78  bone]
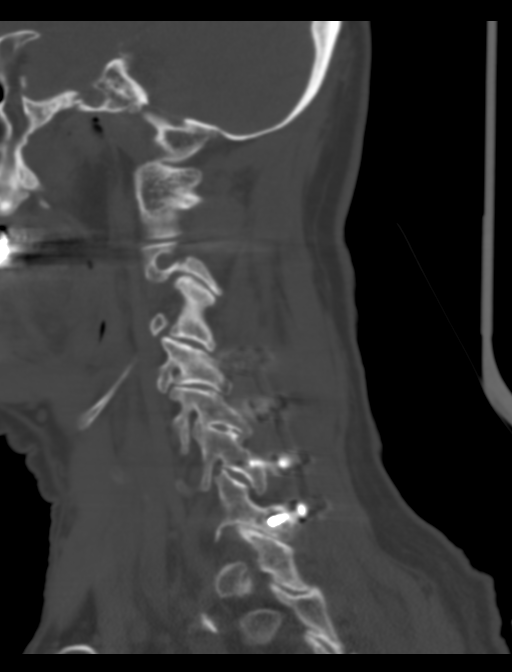

[12 of 33 positions shown; findings below may reference images not displayed]

FINDINGS: There is spondylosis. There is marked disc narrowing at C4-5 and C6-7. 
There is marked narrowing at C5-6 where there appears to have been discectomy. 
There has been posterior decompression from C4 through C7. There are posterior 
element fusion screws with connecting rods extending from C4 through C7 
bilaterally, appearing good position without evidence for hardware failure or 
loosening. 
There is no evidence for recent fracture. No subluxation. Lordosis is 
straightened. The dens and atlantodental interval are intact. The craniocervical 
junction appears open. 
There are no findings to indicate malignancy. 
Axial images show borderline canal narrowing at C2-3 due to mild disc bulge and 
short posterior elements. Foramina are open. 
At C3-4 there is moderate right foraminal stenosis due to uncovertebral 
spurring, axial image 55. Left foramen is open. The canal appears open. 
At C4-5 there is moderate to marked left, moderate right foraminal stenosis, 
axial image 65. The canal is open. 
At C5-6 there is no significant foraminal or canal stenosis. 
At C6-7 there is mild posterior osteophyte approximating the ventral cord. Canal 
diameter is 13 mm, without stenosis. There is mild to moderate bilateral 
foraminal stenosis seen on axial images 81/82. 
At C7-T1 the canal appears open. Foramina are open.
IMPRESSION: Patient is status post C5-6 ACDF, appearing partly ankylosed. There has been 
posterior decompression from C4 through C7 with posterior element fusion. Fusion 
hardware appears appropriately positioned without loosening or failure. 
Multilevel foraminal stenosis as described, appearing most pronounced at C4-5, 
left greater than right. There is moderate right C3-4 foraminal stenosis. 
No evidence for fracture. No aggressive bone destruction to indicate infection 
or malignancy. 
RADIATION DOSE REDUCTION: All CT scans are performed using radiation dose 
reduction techniques, when applicable.  Technical factors are evaluated and 
adjusted to ensure appropriate moderation of exposure.  Automated dose 
management technology is applied to adjust the radiation doses to minimize 
exposure while achieving diagnostic quality images.

## 2021-05-30 IMAGING — MR MRI RIGHT SHOULDER WITHOUT CONTRAST
4 of 6 series · 18 of 40 positions shown · IV contrast (gadolinium)
Comparison: None

FINAL Diagnostic Imaging Report 
________________________________________________________________________________________________ 
MRI RIGHT SHOULDER WITHOUT CONTRAST, 05/30/2021 [DATE]: 
CLINICAL INDICATION: Other injury of muscle, fascia, tendon of long head of 
biceps, right. Right shoulder pain for 3 months. Limited range of motion. 
Cortisone injection 2 months ago with minor relief.
TECHNIQUE: Multiplanar, multiecho position MR images of the shoulder were 
performed without intravenous gadolinium enhancement. 
Patient was scanned on a 1.5T magnet.

[Series 3: PD fat-sat · axial · 3.0mm · 0.31mm/px · z∈[-46,+52]mm · 8 of 29 slices shown]
[im 1/29]
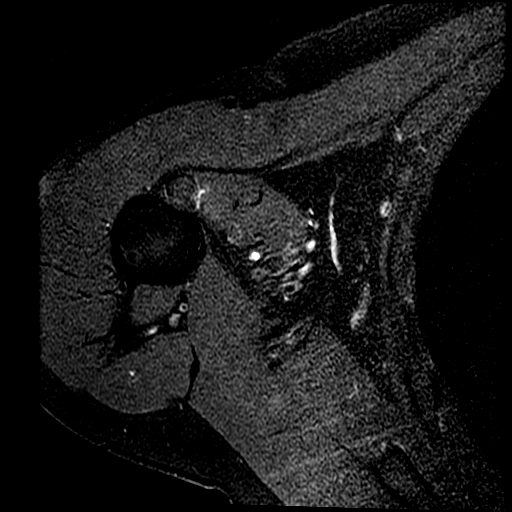
[im 5/29]
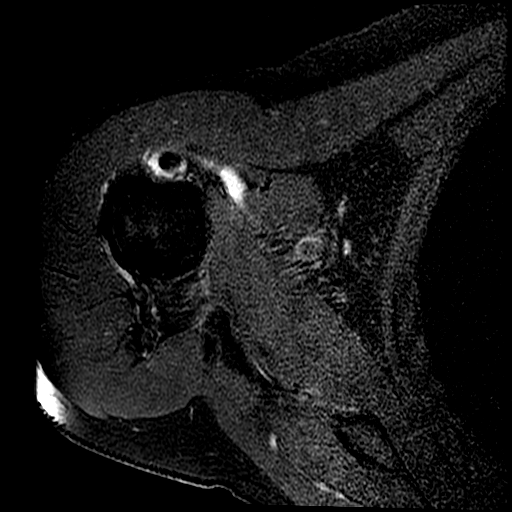
[im 9/29]
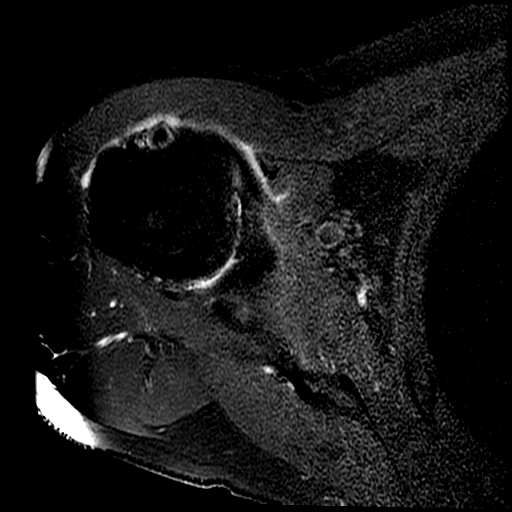
[im 13/29]
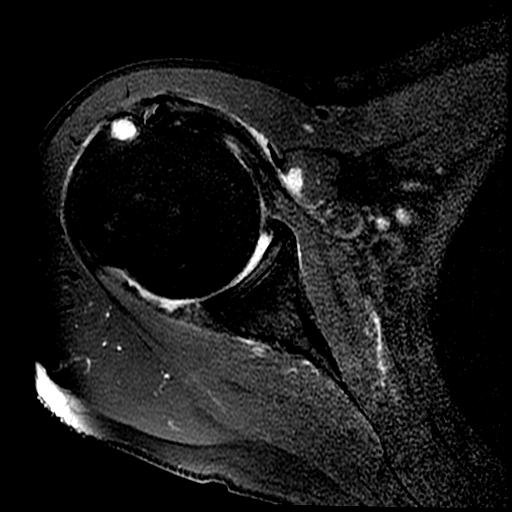
[im 17/29]
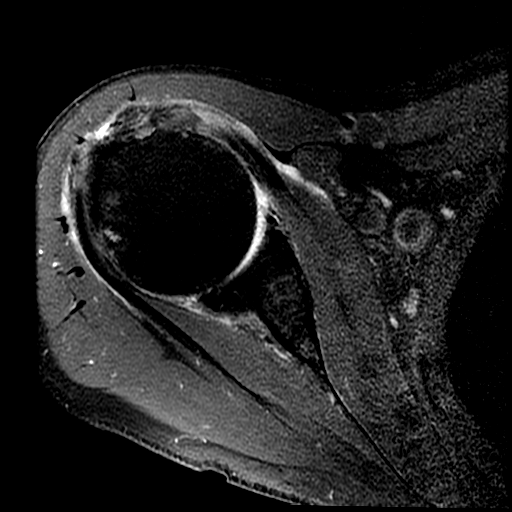
[im 21/29]
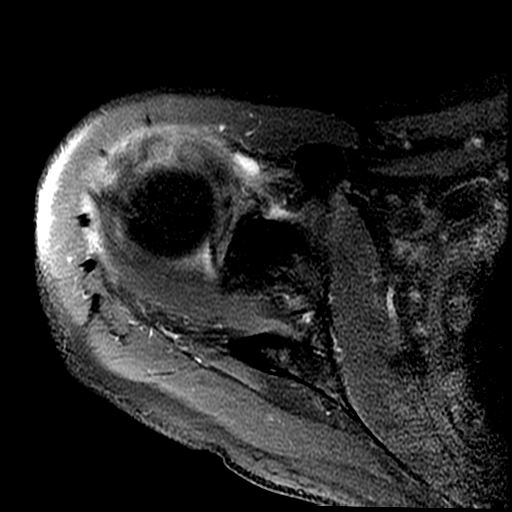
[im 25/29]
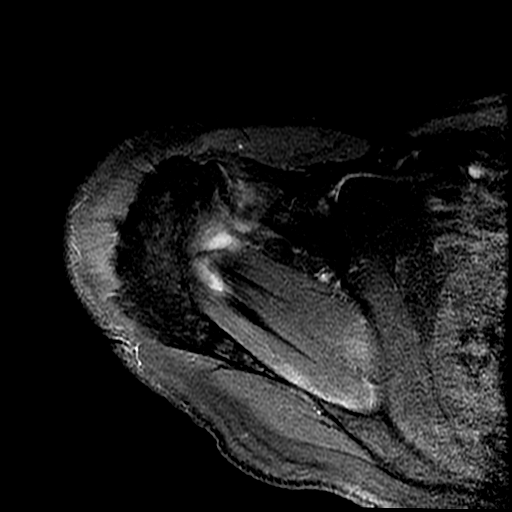
[im 29/29]
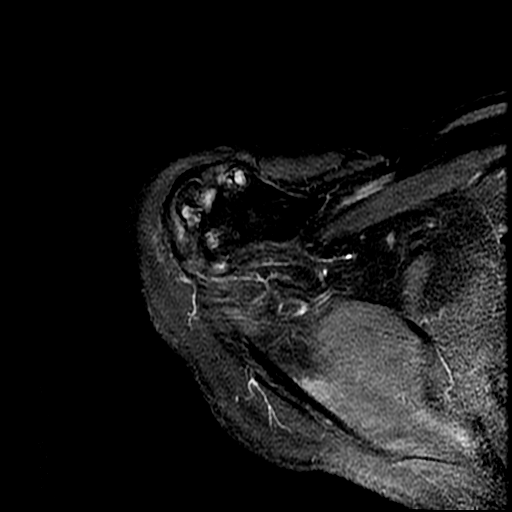

[Series 5: T2 fat-sat · oblique · 3.0mm · 0.31mm/px · 4 of 22 slices shown (1 of 2)]
[im 1/22]
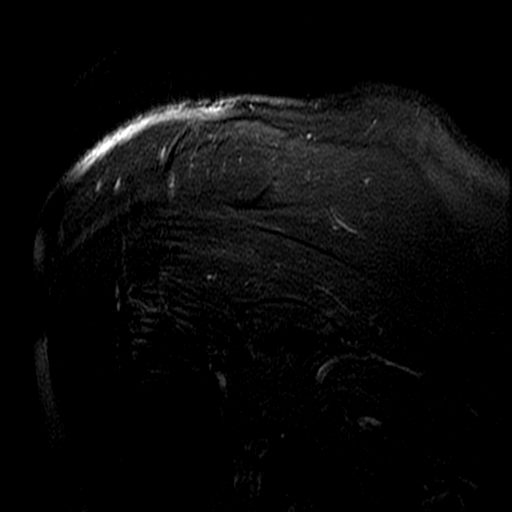
[im 5/22]
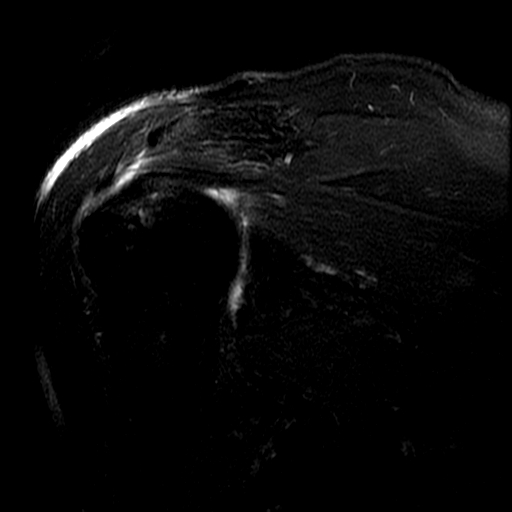
[im 13/22]
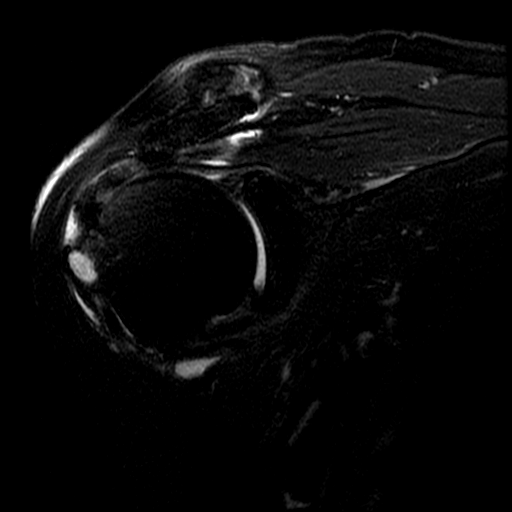
[im 22/22]
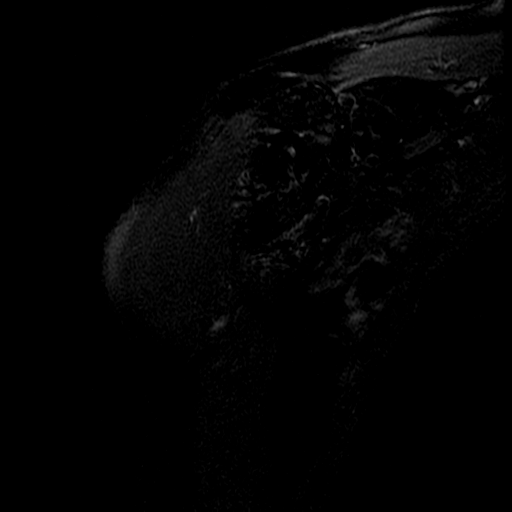

[Series 6: T2 fat-sat · oblique · 4.0mm · 0.31mm/px · 3 of 24 slices shown (2 of 2)]
[im 4/24]
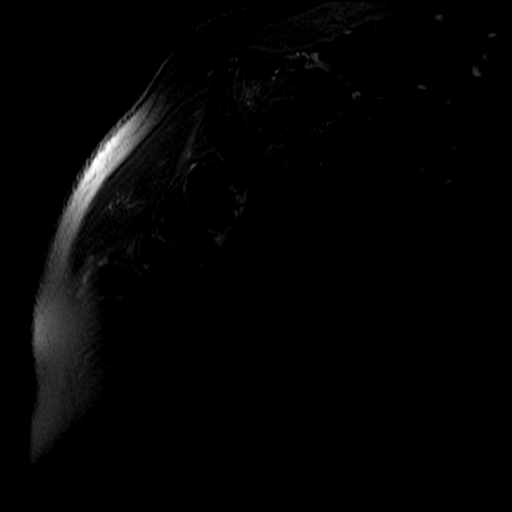
[im 12/24]
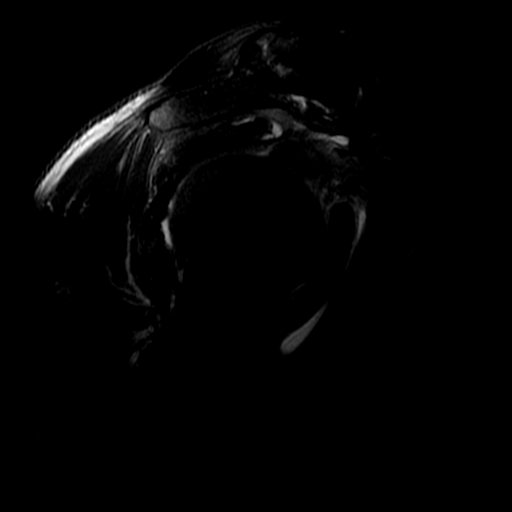
[im 20/24]
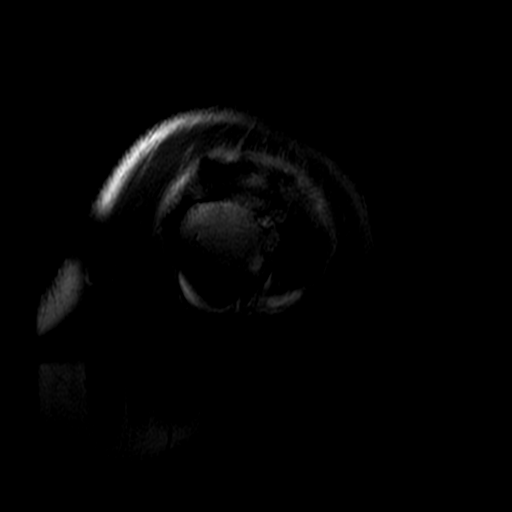

[Series 7: T1 · oblique · 4.0mm · 0.31mm/px · 3 of 24 slices shown]
[im 4/24]
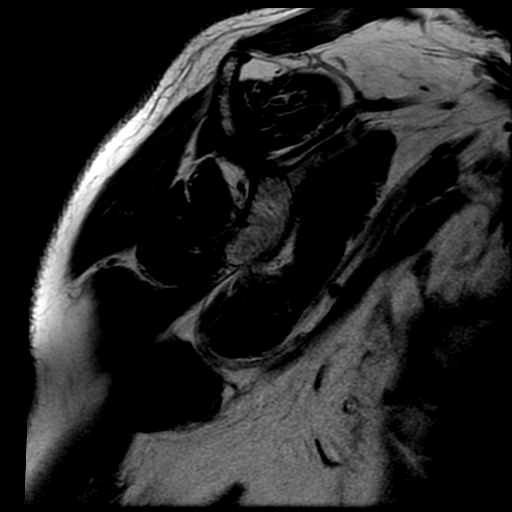
[im 12/24]
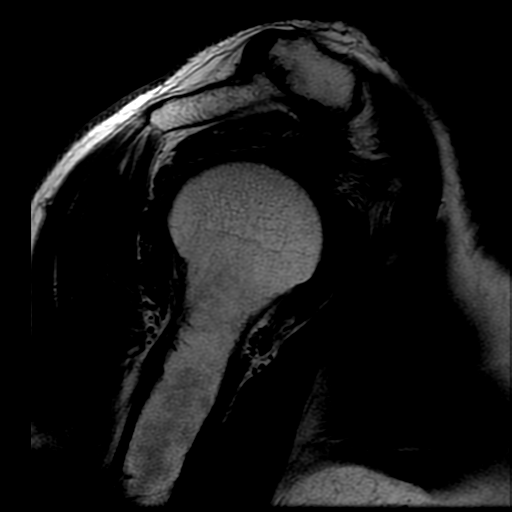
[im 20/24]
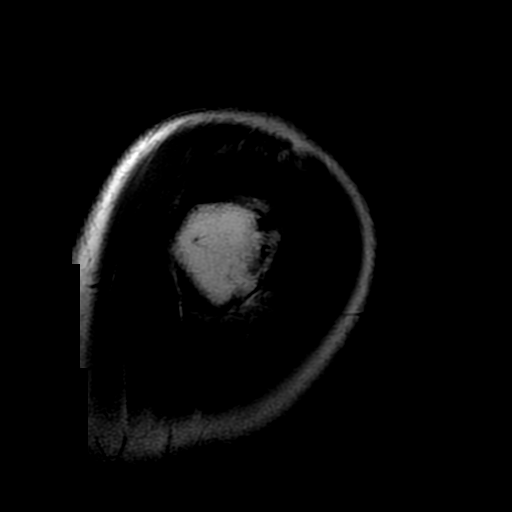

[18 of 40 positions shown; findings below may reference images not displayed]

FINDINGS: ROTATOR CUFF: High-grade partial-thickness bursal sided distal supraspinatus 
tendon tear measures 1.1 cm in AP dimensions, with up to 1.1 cm retraction of 
the bursal sided fibers. The infraspinatus, subscapularis and teres minor 
tendons are intact. The rotator cuff musculature is symmetric without mass, 
signal abnormality or atrophy. 
ACROMIOCLAVICULAR JOINT: Moderate to severe degenerative change of the 
acromioclavicular joint without mass effect upon the supraspinatus. Effacement 
of the subacromial fat. The coracoacromial ligament is intact without prominent 
spurring at the acromial attachment. The acromioclavicular and coracoclavicular 
ligaments are preserved. The acromium is normal in morphology. 
GLENOHUMERAL JOINT: Mild widening of the anterior glenohumeral joint space. 
Articular cartilage is preserved.  The glenoid labrum is preserved. No 
paralabral cyst. Biceps tendinosis. No biceps tendon tear. Small amount of fluid 
in the bicipital tendon sheath. No shoulder joint effusion. 
BONES: The bone marrow signal intensity is negative for fracture. No Hill-Sachs 
defect. Multiple humeral head subcortical cysts, largest of which measures
cm in the greater tuberosity. 
ADDITIONAL FINDINGS: Small amount of fluid in the subacromial/subdeltoid bursa. 
The axillary region is negative. Subcutaneous tissues are negative.
IMPRESSION: 1.  1.1 cm high-grade partial-thickness bursal sided distal supraspinatus tendon 
tear. 
2.  Biceps tendinosis. 
3.  Moderate to severe AC joint degenerative change and effacement of the 
subacromial fat. 
4.  Mild widening of the anterior glenohumeral joint and humeral head 
subcortical cysts.

## 2023-03-11 IMAGING — DX SACRUM/COCCYX 2-3 VIEWS
2 series · 3 of 3 positions shown · non-contrast
Comparison: None.

________________________________________________________________________________________________ 
SACRUM/COCCYX 2-3 VIEWS, 03/11/2023 [DATE]: 
CLINICAL INDICATION: Radiculopathy, lumbosacral region.

[Series 1: AP · 0.14mm/px · 2 of 2 slices shown (1 of 2)]
[im 1/2]
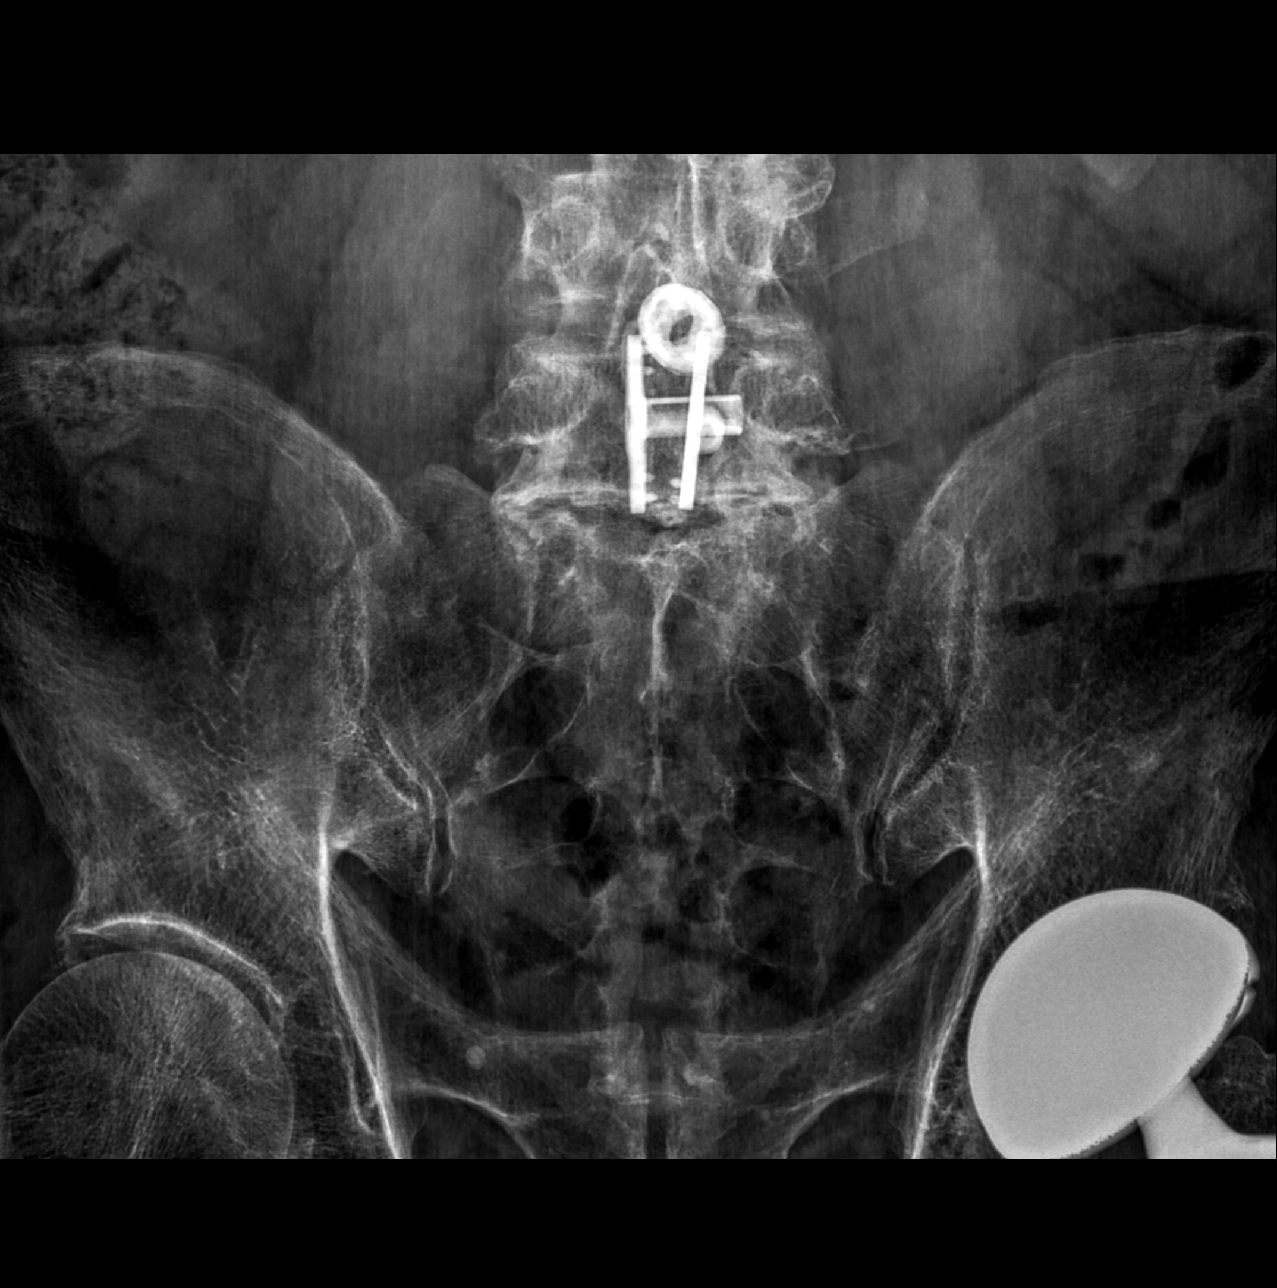
[im 2/2]
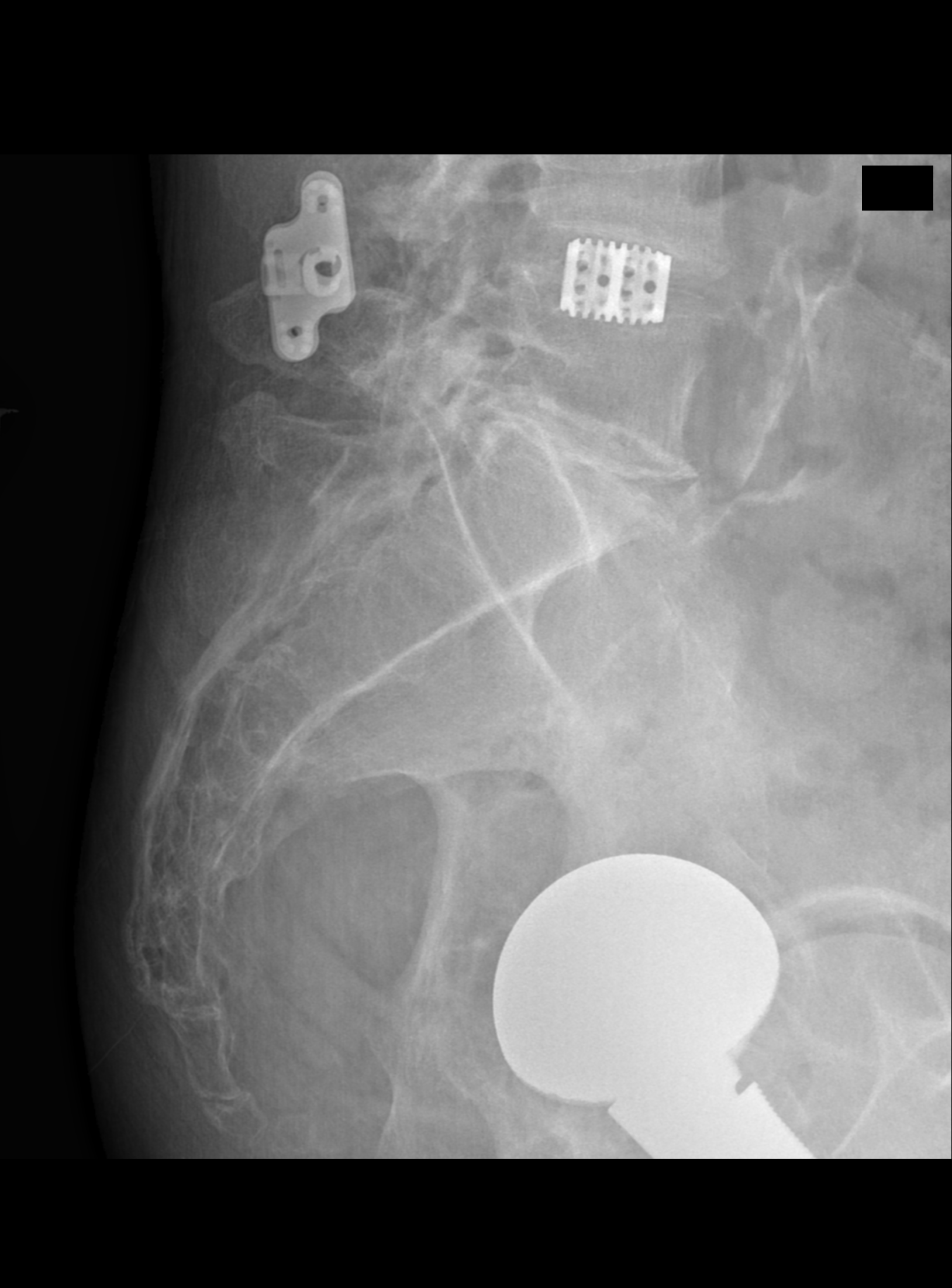

[AP (2 of 2)]
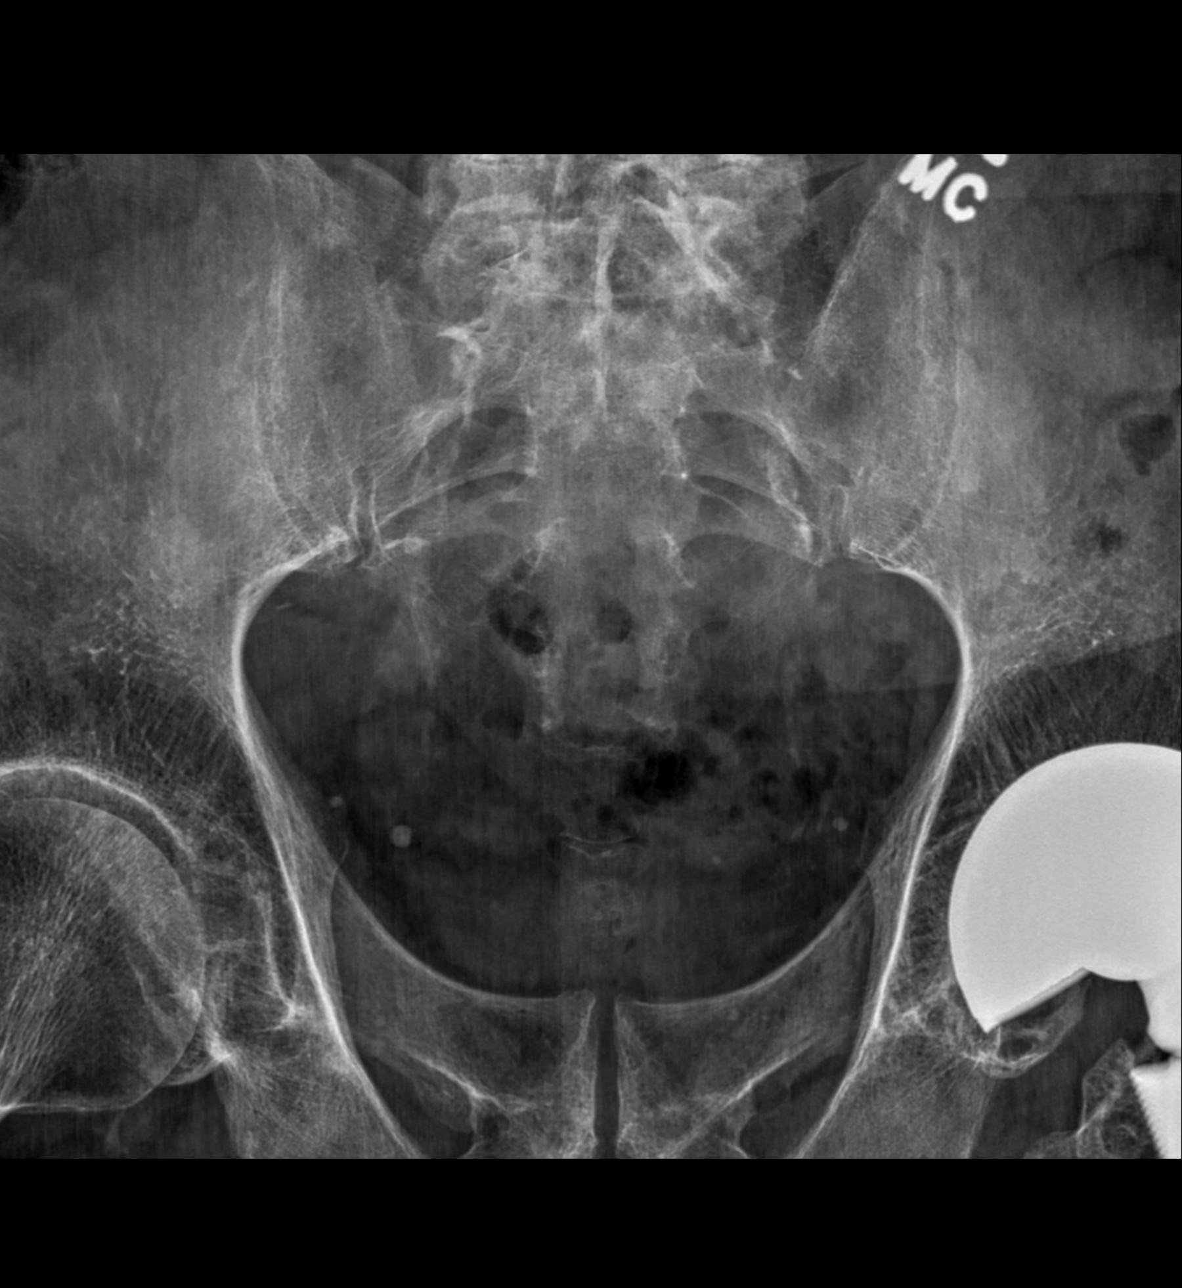

[3 of 3 positions shown; findings below may reference images not displayed]

FINDINGS: No sacral fracture. L4-5 metallic intervertebral disc spacer and 
posterior distraction device. Multilevel degenerative change of the spine. Mild 
degenerative change of the SI joints. Left noncemented total hip arthroplasty 
(THA). Osteopenia. Atherosclerosis. Pelvic phleboliths.
IMPRESSION: 1.  No sacral fracture.  
2.  Postsurgical/degenerative change of the spine. 
3.  Degenerative change of the SI joints and left THA.

## 2023-03-23 IMAGING — MR MRI LUMBAR SPINE WITHOUT CONTRAST
7 of 8 series · 16 of 48 positions shown · IV contrast (gadolinium)
Comparison: Sacrum and coccyx x-ray March 11, 2023. CT lumbar spine April 28, 2020 
and lumbar spine x-ray April 28, 2020.

________________________________________________________________________________________________ 
MRI LUMBAR SPINE WITHOUT CONTRAST, 03/23/2023 [DATE]: 
CLINICAL INDICATION: Lumbosacral radiculopathy. Chronic low back pain. 1 prior 
fusion in the lumbar spine.
TECHNIQUE: Multiplanar, multiecho position MR images of the lumbar spine were 
performed without intravenous gadolinium enhancement. Patient was scanned on a 
1.5T magnet.

[Series 101: survey · axial · 10.0mm · 1.25mm/px · 1 of 10 slices shown]
[im 1/10]
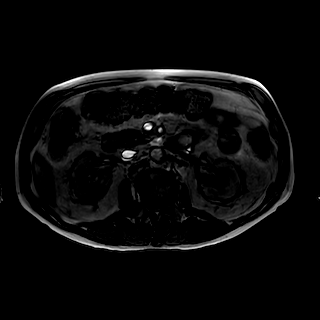

[Series 201: t2w_cor-surv · coronal · 6.0mm · 0.62mm/px · 1 of 10 slices shown]
[im 1/10]
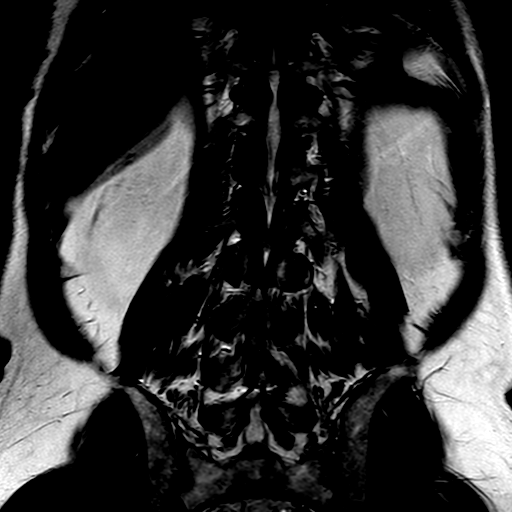

[Series 301: T1 · sagittal · 4.0mm · 0.46mm/px · 3 of 18 slices shown]
[im 1/18]
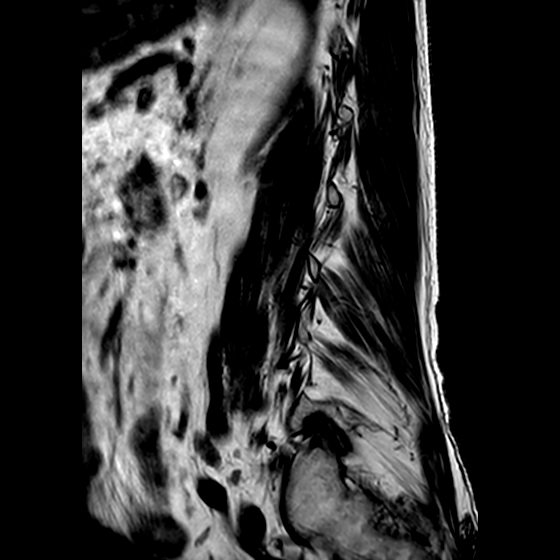
[im 9/18]
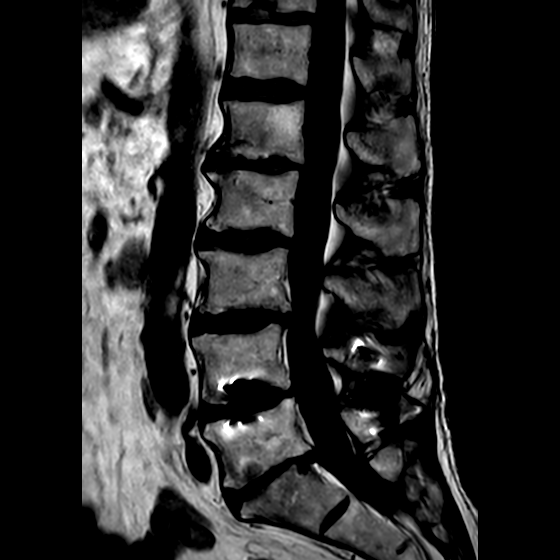
[im 18/18]
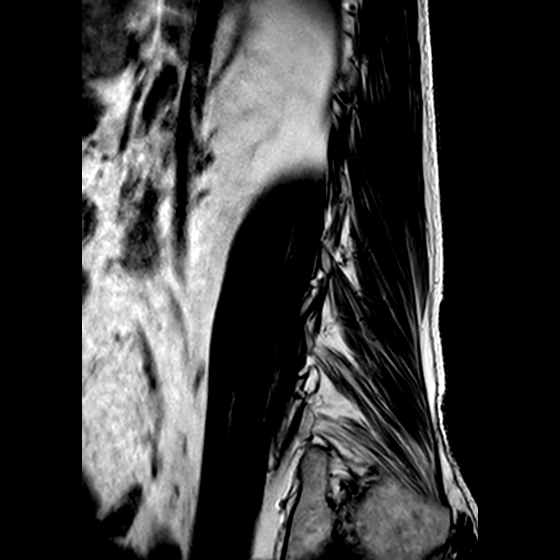

[Series 402: (id)_mdixon_tse · sagittal · 4.0mm · 0.50mm/px · 3 of 18 slices shown]
[im 1/18]
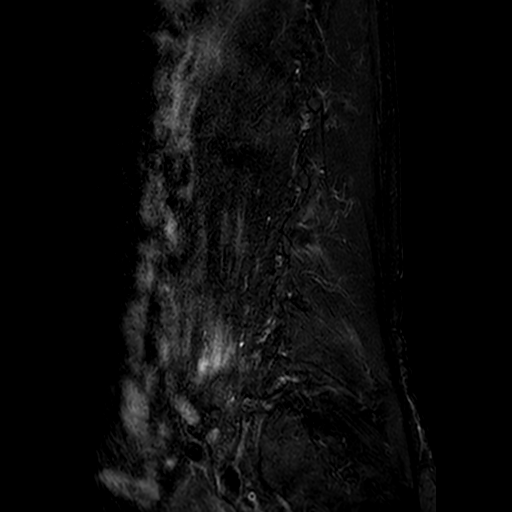
[im 9/18]
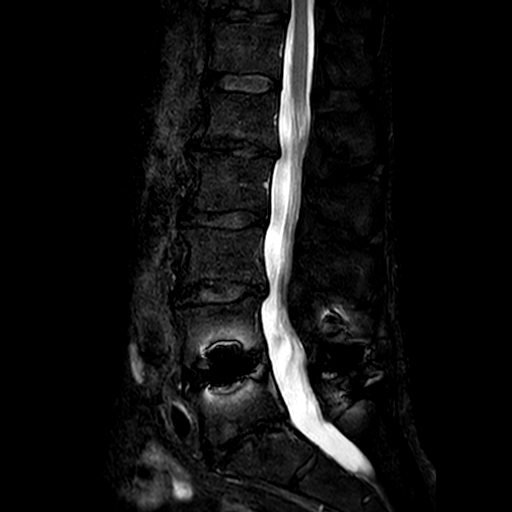
[im 18/18]
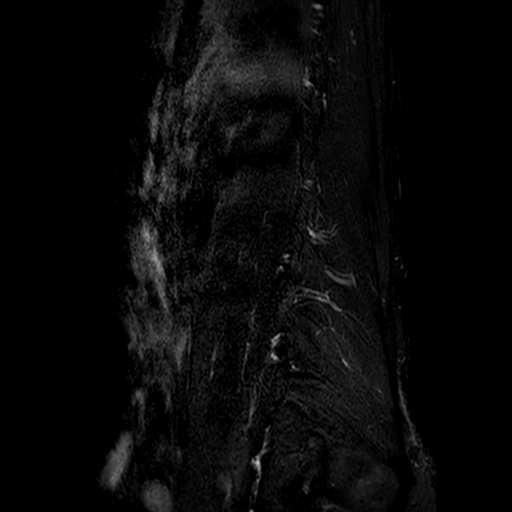

[Series 403: st2w_mdixon_tse · sagittal · 4.0mm · 0.50mm/px · 3 of 18 slices shown]
[im 1/18]
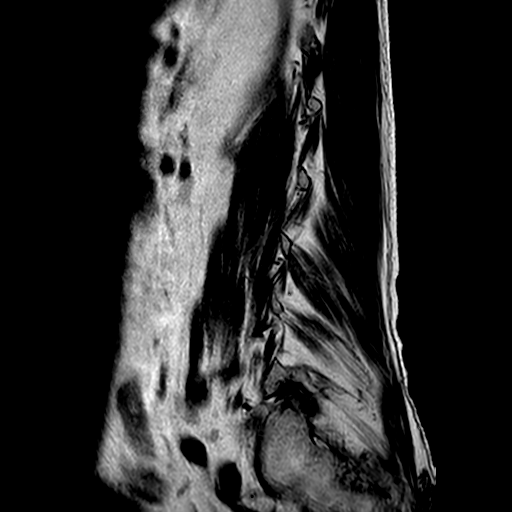
[im 9/18]
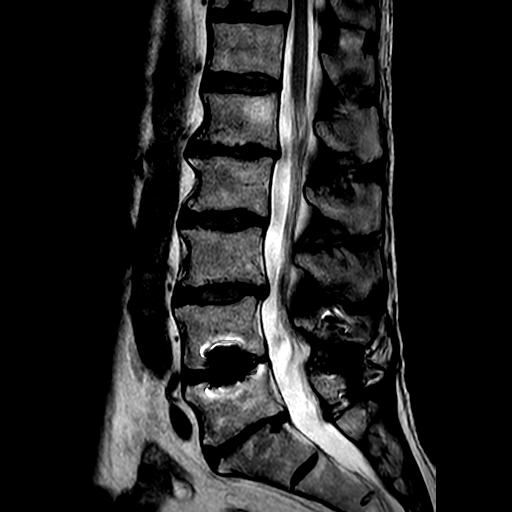
[im 18/18]
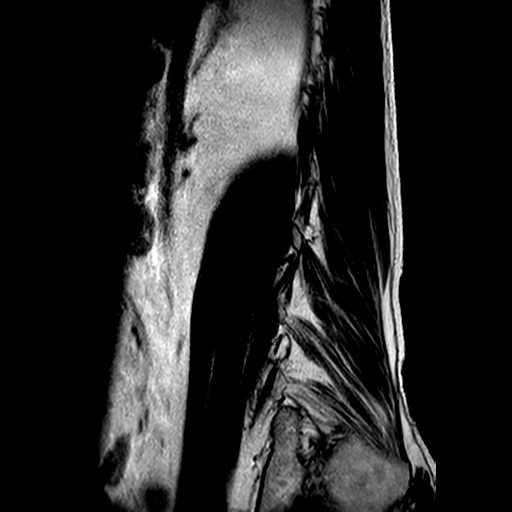

[Series 501: sag spineview ax · sagittal · 1.4mm · 0.33mm/px · 1 of 114 slices shown]
[im 8/114]
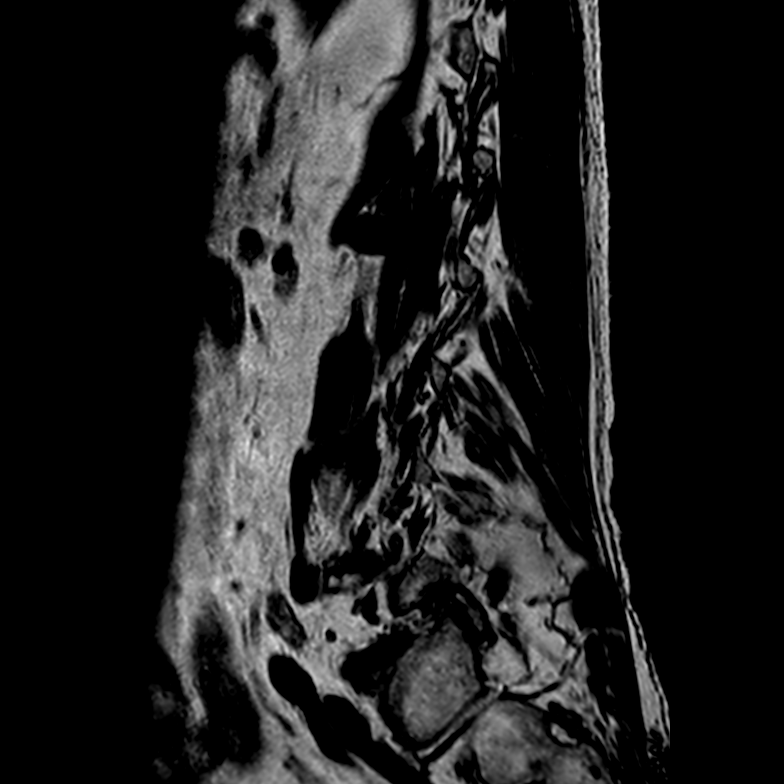

[Series 601: T2 · axial · 4.0mm · 0.30mm/px · z∈[-196,+40]mm · 4 of 30 slices shown]
[im 1/30]
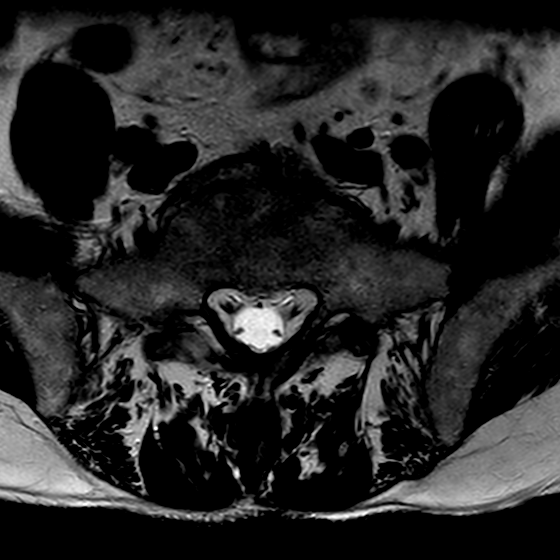
[im 10/30]
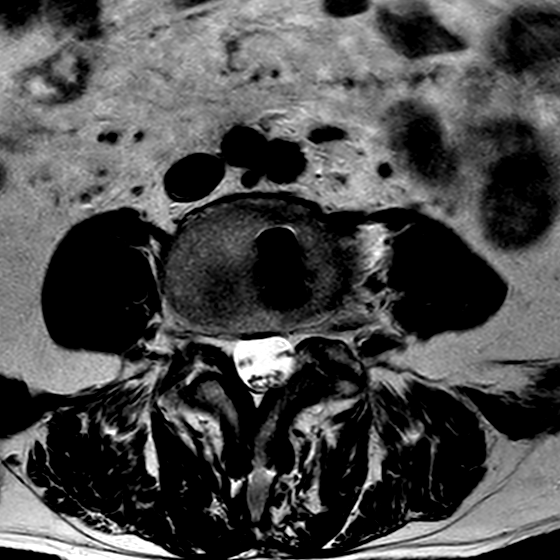
[im 20/30]
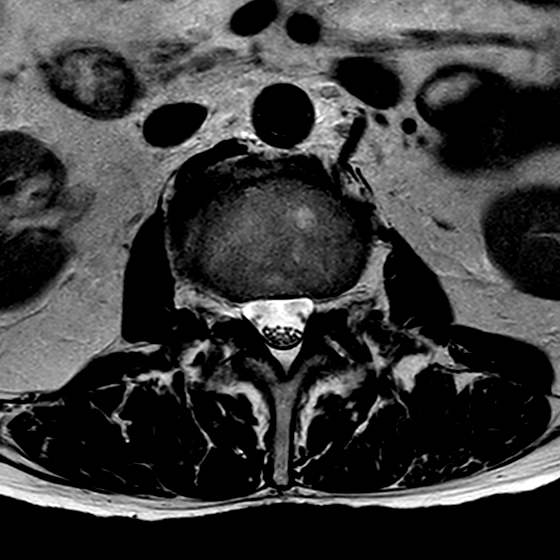
[im 30/30]
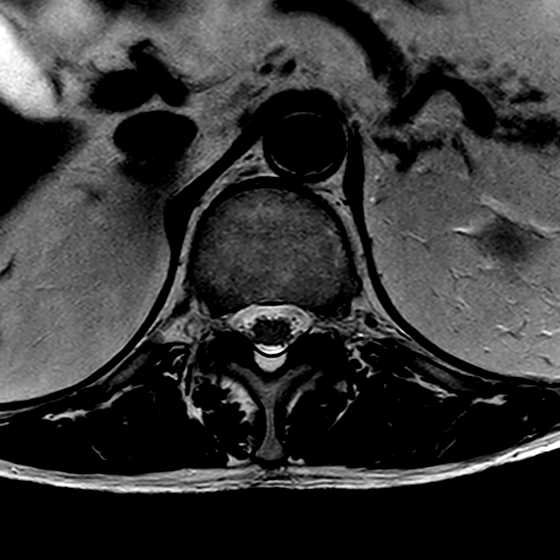

[16 of 48 positions shown; findings below may reference images not displayed]

FINDINGS: Anterior interbody graft material L4-L5. Interspinous fusion hardware L4-L5 
level. Left hemilaminectomy change L4-L5. 
-------------------------------------------------------------------------------- 
------ 
GENERAL: 
Nomenclature is based on 5 lumbar type vertebral bodies.     
ALIGNMENT: Mild dextroconvex lumbar scoliosis. Loss of the normal lumbar 
lordosis with grade 1 retrolisthesis L2 on L3 and L3 on L4 and L5 on S1. No 
significant change in alignment. 
VERTEBRAL BODY HEIGHT: Normal.  
MARROW SIGNAL: No focal suspect signal abnormality. 
CORD SIGNAL: Normal distal spinal cord and cauda equina. Conus medullaris 
terminates at inferior L1. 
ADDITIONAL FINDINGS: None. 
Modic I-II: L1-L2, L3-L4. 
Ligamentum Flavum > 2.5 mm: All except the laminectomy site. 
-------------------------------------------------------------------------------- 
------ 
SEGMENTAL: 
T12-L1: Loss of disc signal. Otherwise normal. Stable. 
L1-L2: Moderate loss of disc height specifically to the right. Loss of disc 
signal. Anterior osteophytes. Minimal annular bulge. Slight narrowing right 
lateral recess. Mild canal stenosis. Mild facet arthropathy. Left foramen 
patent. Right foramen is moderately narrowed. Stable. 
L2-L3: Mild loss of disc height and signal. Anterior osteophytes. Mild diffuse 
annular bulge. Canal patent. Bilateral facet joint effusions. Mild left 
foraminal narrowing. Right foramen patent. Stable. 
L3-L4: Mild loss of disc height and signal. Mild canal stenosis with narrowing 
of the left lateral recess. Right facet joint effusion with bilateral facet 
arthropathy. Mild-to-moderate left and mild right foraminal narrowing. Stable. 
L4-L5: Fusion with patent canal. Mild bilateral foraminal narrowing. Facet 
arthropathy. Stable. 
L5-S1: Moderate to severe loss of disc height. Loss of disc signal. 
Retrolisthesis with disc unroofing. Slight narrowing of the lateral recesses 
bilaterally, worse on the left, by ligamentum flavum hypertrophy. There is also 
evidence of left paracentral disc extrusion which is small in size which 
contributes to the left lateral recess narrowing. Canal otherwise patent. 
Moderately severe left and right foraminal narrowing with facet arthropathy. 
Stable. 
-------------------------------------------------------------------------------- 
------
IMPRESSION: Postsurgical changes as detailed above. 
Stable lumbar degenerative changes. No significant or critical central canal 
narrowing. Variable lateral recess and foraminal narrowing detailed above. 
Specific narrowing left lateral recess L5-S1 could impact the traversing left S1 
nerve root. Correlate clinically.
